# Patient Record
Sex: Female | Born: 1940 | Hispanic: No | Marital: Married | Smoking: Never smoker
Health system: Southern US, Community
[De-identification: ages and names within clinical notes are randomized; demographics above are authoritative.]

## PROBLEM LIST (undated history)

## (undated) DIAGNOSIS — M179 Osteoarthritis of knee, unspecified: Secondary | ICD-10-CM

## (undated) DIAGNOSIS — M810 Age-related osteoporosis without current pathological fracture: Secondary | ICD-10-CM

## (undated) DIAGNOSIS — M1710 Unilateral primary osteoarthritis, unspecified knee: Secondary | ICD-10-CM

## (undated) DIAGNOSIS — E785 Hyperlipidemia, unspecified: Secondary | ICD-10-CM

## (undated) DIAGNOSIS — I10 Essential (primary) hypertension: Secondary | ICD-10-CM

## (undated) DIAGNOSIS — E039 Hypothyroidism, unspecified: Secondary | ICD-10-CM

## (undated) DIAGNOSIS — E119 Type 2 diabetes mellitus without complications: Secondary | ICD-10-CM

## (undated) HISTORY — PX: THYROID SURGERY: SHX805

## (undated) HISTORY — DX: Osteoarthritis of knee, unspecified: M17.9

## (undated) HISTORY — DX: Hypothyroidism, unspecified: E03.9

## (undated) HISTORY — DX: Age-related osteoporosis without current pathological fracture: M81.0

## (undated) HISTORY — DX: Hyperlipidemia, unspecified: E78.5

## (undated) HISTORY — DX: Type 2 diabetes mellitus without complications: E11.9

## (undated) HISTORY — DX: Essential (primary) hypertension: I10

## (undated) HISTORY — PX: NO PAST SURGERIES: SHX2092

## (undated) HISTORY — DX: Unilateral primary osteoarthritis, unspecified knee: M17.10

---

## 2007-05-11 ENCOUNTER — Encounter: Payer: Self-pay | Admitting: Internal Medicine

## 2007-05-11 LAB — CONVERTED CEMR LAB
ALT: 8 units/L
AST: 9 units/L
BUN: 22 mg/dL
Basophils Relative: 0 %
Chloride: 100 meq/L
Creatinine, Ser: 0.64 mg/dL
Eosinophils Relative: 1 %
HCT: 39.8 %
Neutrophils Relative %: 68 %
Potassium: 4.5 meq/L
Sodium: 138 meq/L
Total Bilirubin: 0.3 mg/dL

## 2008-12-05 ENCOUNTER — Emergency Department (HOSPITAL_COMMUNITY): Admission: EM | Admit: 2008-12-05 | Discharge: 2008-12-05 | Payer: Self-pay | Admitting: Emergency Medicine

## 2008-12-11 ENCOUNTER — Encounter (INDEPENDENT_AMBULATORY_CARE_PROVIDER_SITE_OTHER): Payer: Self-pay | Admitting: *Deleted

## 2008-12-11 ENCOUNTER — Ambulatory Visit (HOSPITAL_COMMUNITY): Admission: RE | Admit: 2008-12-11 | Discharge: 2008-12-11 | Payer: Self-pay | Admitting: Emergency Medicine

## 2009-01-24 ENCOUNTER — Ambulatory Visit: Payer: Self-pay | Admitting: Gastroenterology

## 2009-01-24 DIAGNOSIS — R1013 Epigastric pain: Secondary | ICD-10-CM | POA: Insufficient documentation

## 2009-01-30 ENCOUNTER — Telehealth: Payer: Self-pay | Admitting: Gastroenterology

## 2009-01-31 ENCOUNTER — Ambulatory Visit: Payer: Self-pay | Admitting: Gastroenterology

## 2009-02-01 ENCOUNTER — Encounter: Payer: Self-pay | Admitting: Gastroenterology

## 2009-02-22 ENCOUNTER — Encounter (INDEPENDENT_AMBULATORY_CARE_PROVIDER_SITE_OTHER): Payer: Self-pay

## 2009-03-12 ENCOUNTER — Ambulatory Visit: Payer: Self-pay | Admitting: Internal Medicine

## 2009-03-12 DIAGNOSIS — M810 Age-related osteoporosis without current pathological fracture: Secondary | ICD-10-CM | POA: Insufficient documentation

## 2009-03-12 DIAGNOSIS — M1710 Unilateral primary osteoarthritis, unspecified knee: Secondary | ICD-10-CM | POA: Insufficient documentation

## 2009-03-12 DIAGNOSIS — E785 Hyperlipidemia, unspecified: Secondary | ICD-10-CM | POA: Insufficient documentation

## 2009-03-12 DIAGNOSIS — IMO0002 Reserved for concepts with insufficient information to code with codable children: Secondary | ICD-10-CM

## 2009-03-12 DIAGNOSIS — I10 Essential (primary) hypertension: Secondary | ICD-10-CM | POA: Insufficient documentation

## 2009-03-12 DIAGNOSIS — E039 Hypothyroidism, unspecified: Secondary | ICD-10-CM | POA: Insufficient documentation

## 2009-03-13 LAB — CONVERTED CEMR LAB
BUN: 17 mg/dL (ref 6–23)
CO2: 28 meq/L (ref 19–32)
GFR calc non Af Amer: 130.49 mL/min (ref >60)
Glucose, Bld: 113 mg/dL — ABNORMAL HIGH (ref 70–99)
Potassium: 3.8 meq/L (ref 3.5–5.1)

## 2009-03-19 ENCOUNTER — Telehealth: Payer: Self-pay | Admitting: Internal Medicine

## 2009-05-04 ENCOUNTER — Ambulatory Visit: Payer: Self-pay | Admitting: Internal Medicine

## 2009-05-04 DIAGNOSIS — M545 Low back pain, unspecified: Secondary | ICD-10-CM | POA: Insufficient documentation

## 2009-06-06 ENCOUNTER — Ambulatory Visit: Payer: Self-pay | Admitting: Internal Medicine

## 2009-06-06 DIAGNOSIS — R5383 Other fatigue: Secondary | ICD-10-CM

## 2009-06-06 DIAGNOSIS — R5381 Other malaise: Secondary | ICD-10-CM | POA: Insufficient documentation

## 2009-06-06 LAB — CONVERTED CEMR LAB
ALT: 13 units/L (ref 0–35)
AST: 16 units/L (ref 0–37)
Albumin: 3.7 g/dL (ref 3.5–5.2)
BUN: 24 mg/dL — ABNORMAL HIGH (ref 6–23)
Chloride: 104 meq/L (ref 96–112)
Cholesterol: 145 mg/dL (ref 0–200)
Creatinine, Ser: 0.8 mg/dL (ref 0.4–1.2)
GFR calc non Af Amer: 75.81 mL/min (ref >60)
Glucose, Bld: 134 mg/dL — ABNORMAL HIGH (ref 70–99)
HCT: 38.7 % (ref 36.0–46.0)
HDL: 46.3 mg/dL (ref >39.00)
LDL Cholesterol: 73 mg/dL (ref 0–99)
Lymphocytes Relative: 26.3 % (ref 12.0–46.0)
Lymphs Abs: 1.9 10*3/uL (ref 0.7–4.0)
MCV: 85.4 fL (ref 78.0–100.0)
Monocytes Relative: 6.6 % (ref 3.0–12.0)
Neutro Abs: 4.7 10*3/uL (ref 1.4–7.7)
Platelets: 213 10*3/uL (ref 150.0–400.0)
RDW: 13.5 % (ref 11.5–14.6)
Sodium: 138 meq/L (ref 135–145)
Total Bilirubin: 0.4 mg/dL (ref 0.3–1.2)
Total Protein: 6.9 g/dL (ref 6.0–8.3)
WBC: 7.2 10*3/uL (ref 4.5–10.5)

## 2009-06-12 ENCOUNTER — Telehealth: Payer: Self-pay | Admitting: Internal Medicine

## 2009-06-27 ENCOUNTER — Ambulatory Visit: Payer: Self-pay | Admitting: Internal Medicine

## 2009-06-27 DIAGNOSIS — J069 Acute upper respiratory infection, unspecified: Secondary | ICD-10-CM | POA: Insufficient documentation

## 2009-07-19 ENCOUNTER — Ambulatory Visit: Payer: Self-pay | Admitting: Internal Medicine

## 2009-07-19 DIAGNOSIS — R05 Cough: Secondary | ICD-10-CM

## 2009-07-19 DIAGNOSIS — R059 Cough, unspecified: Secondary | ICD-10-CM | POA: Insufficient documentation

## 2009-07-19 DIAGNOSIS — R55 Syncope and collapse: Secondary | ICD-10-CM | POA: Insufficient documentation

## 2009-07-20 ENCOUNTER — Encounter: Payer: Self-pay | Admitting: Internal Medicine

## 2009-08-08 ENCOUNTER — Telehealth: Payer: Self-pay | Admitting: Internal Medicine

## 2010-01-23 ENCOUNTER — Ambulatory Visit: Payer: Self-pay | Admitting: Internal Medicine

## 2010-01-24 ENCOUNTER — Telehealth: Payer: Self-pay | Admitting: Internal Medicine

## 2010-01-28 ENCOUNTER — Telehealth: Payer: Self-pay | Admitting: Internal Medicine

## 2010-01-29 ENCOUNTER — Ambulatory Visit: Payer: Self-pay | Admitting: Internal Medicine

## 2010-01-29 DIAGNOSIS — J209 Acute bronchitis, unspecified: Secondary | ICD-10-CM | POA: Insufficient documentation

## 2010-05-28 NOTE — Progress Notes (Signed)
Summary: RX request  Phone Note Refill Request Message from:  Fax from Pharmacy on June 12, 2009 8:36 AM  Methocarbamol 500mg     **This is not on patient med list, please advise.  Initial call taken by: Lucious Groves,  June 12, 2009 8:37 AM  Follow-up for Phone Call        e-rx done Follow-up by: Newt Lukes MD,  June 12, 2009 8:44 AM  Additional Follow-up for Phone Call Additional follow up Details #1::        Thanks. Additional Follow-up by: Lucious Groves,  June 12, 2009 8:52 AM    New/Updated Medications: ROBAXIN 500 MG TABS (METHOCARBAMOL) 1 by mouth every 8hours as needed for muscle spasms Prescriptions: ROBAXIN 500 MG TABS (METHOCARBAMOL) 1 by mouth every 8hours as needed for muscle spasms  #30 x 1   Entered and Authorized by:   Newt Lukes MD   Signed by:   Newt Lukes MD on 06/12/2009   Method used:   Electronically to        [facility] # [phone]* (retail)       [address] [place], [place]  [address]       Ph: [phone]       Fax: [phone]   RxID:   [id number]

## 2010-05-28 NOTE — Progress Notes (Signed)
Summary: Cold sxs  Phone Note Call from Patient   Caller: Son  Summary of Call: Pt's son called, pt c/o severe cough, body aches, congestion, runny nose and ST. Pt's son is requesting ABX and cough medicine. Please advise. Initial call taken by: Margaret Pyle, CMA,  January 28, 2010 4:21 PM  Follow-up for Phone Call        zpack (erx done) and hydromet - may call into pharm -then notify son of same - thanks Follow-up by: Newt Lukes MD,  January 28, 2010 4:53 PM  Additional Follow-up for Phone Call Additional follow up Details #1::        Pt's son informed, rx called into pharm Additional Follow-up by: Lamar Sprinkles, CMA,  January 28, 2010 5:30 PM    New/Updated Medications: AZITHROMYCIN 250 MG TABS (AZITHROMYCIN) 2 tabs by mouth today, then 1 by mouth daily starting tomorrow HYDROMET 5-1.5 MG/5ML SYRP (HYDROCODONE-HOMATROPINE) 5 cc by mouth every 4 hours as needed for cough Prescriptions: HYDROMET 5-1.5 MG/5ML SYRP (HYDROCODONE-HOMATROPINE) 5 cc by mouth every 4 hours as needed for cough  #120cc x 0   Entered and Authorized by:   Newt Lukes MD   Signed by:   Newt Lukes MD on 01/28/2010   Method used:   Historical   RxID:   [id number] AZITHROMYCIN 250 MG TABS (AZITHROMYCIN) 2 tabs by mouth today, then 1 by mouth daily starting tomorrow  #6 x 0   Entered and Authorized by:   Newt Lukes MD   Signed by:   Newt Lukes MD on 01/28/2010   Method used:   Electronically to        [facility] # [phone]* (retail)       [address] [place], [place]  [address]       Ph: [phone]       Fax: [phone]   RxID:   [id number]

## 2010-05-28 NOTE — Assessment & Plan Note (Signed)
Summary: BP:  160/105  BP MED NOT WORKING-LEGS PAIN--PER SON--STC   Vital Signs:  Patient profile:   70 year old female Height:      61.5 inches Weight:      216.75 pounds BMI:     40.44 O2 Sat:      96 % on Room air Temp:     98.1 degrees F oral Pulse rate:   67 / minute BP sitting:   162 / 96  (left arm) Cuff size:   regular  Vitals Entered By: Margaret Pyle, CMA (January 23, 2010 9:32 AM)  O2 Flow:  Room air CC: HA with Elevated BP, numbness of both feet   Primary Care Provider:  Newt Lukes MD  CC:  HA with Elevated BP and numbness of both feet.  History of Present Illness: HTN - reports compliance with ongoing medical treatment and no changes in medication dose or frequency. denies adverse side effects related to current therapy, but feels BP not well controlled - SBP 150-170 at home - no CP or increase in HA symptoms, no weakness or increase edema  OA - bilateral knee pain, acute on chronic hx shots in knees every year, set of 3 shots --?synvisc s/p steroid shot 3 months ago - improved but not resolved denies new injury - occ swelling no OTC pain pill use or rx med use   Current Medications (verified): 1)  Simvastatin 20 Mg Tabs (Simvastatin) .... Once Daily 2)  Levothyroxine Sodium 75 Mcg Tabs (Levothyroxine Sodium) .... Once Daily 3)  Prilosec Otc 20 Mg Tbec (Omeprazole Magnesium) .... One Tablet By Mouth Once Daily 4)  Losartan Potassium 100 Mg Tabs (Losartan Potassium) .... Once Daily 5)  Atenolol 100 Mg Tabs (Atenolol) .... Once Daily 6)  Hydrochlorothiazide 25 Mg Tabs (Hydrochlorothiazide) .Marland Kitchen.. 1 By Mouth Once Daily 7)  Calcium 500 Mg Tabs (Calcium) .Marland Kitchen.. 1 By Mouth Once Daily 8)  Fosamax 10 Mg Tabs (Alendronate Sodium) .Marland Kitchen.. 1 By Mouth Once Daily  Allergies (verified): No Known Drug Allergies  Review of Systems  The patient denies anorexia, fever, vision loss, chest pain, dyspnea on exertion, and abdominal pain.    Physical  Exam  General:  alert, well-developed, well-nourished, and cooperative to examination.   son at side - pt nonEnglish speaking Lungs:  normal respiratory effort, no intercostal retractions or use of accessory muscles; normal breath sounds bilaterally - no crackles and no wheezes  Heart:  normal rate, regular rhythm, no murmur, and no rub. BLE with trace ankle edema   Impression & Recommendations:  Problem # 1:  HYPERTENSION (ICD-401.9)  Her updated medication list for this problem includes:    Losartan Potassium 100 Mg Tabs (Losartan potassium) ..... Once daily    Atenolol 100 Mg Tabs (Atenolol) ..... Once daily    Hydrochlorothiazide 25 Mg Tabs (Hydrochlorothiazide) .Marland Kitchen... 1 by mouth once daily    Amlodipine Besylate 2.5 Mg Tabs (Amlodipine besylate) .Marland Kitchen... 1 by mouth once daily  add amlodipine to current regimen - watch for inc HA or edema - recheck 2-4 weeks and try to reduce other meds if possible to simplify med regimen  BP today: 162/96 Prior BP: 132/92 (07/19/2009)  Labs Reviewed: K+: 3.7 (06/06/2009) Creat: : 0.8 (06/06/2009)   Chol: 145 (06/06/2009)   HDL: 46.30 (06/06/2009)   LDL: 73 (06/06/2009)   TG: 130.0 (06/06/2009)  Orders: Prescription Created Electronically [phone])  Problem # 2:  ARTHRITIS, RIGHT KNEE (ICD-716.96)  both knees - not controlled with oral antiinflam  meds but try voltaren x 3 days describes series of synthetic viscous replacment shots in past - and ortho considering same f/u as planned 3 mo s/p steroid shot done  Orders: Prescription Created Electronically [phone])  Complete Medication List: 1)  Simvastatin 20 Mg Tabs (Simvastatin) .... Once daily 2)  Levothyroxine Sodium 75 Mcg Tabs (Levothyroxine sodium) .... Once daily 3)  Prilosec Otc 20 Mg Tbec (Omeprazole magnesium) .... One tablet by mouth once daily 4)  Losartan Potassium 100 Mg Tabs (Losartan potassium) .... Once daily 5)  Atenolol 100 Mg Tabs (Atenolol) .... Once daily 6)   Hydrochlorothiazide 25 Mg Tabs (Hydrochlorothiazide) .Marland Kitchen.. 1 by mouth once daily 7)  Calcium 500 Mg Tabs (Calcium) .Marland Kitchen.. 1 by mouth once daily 8)  Fosamax 10 Mg Tabs (Alendronate sodium) .Marland Kitchen.. 1 by mouth once daily 9)  Amlodipine Besylate 2.5 Mg Tabs (Amlodipine besylate) .Marland Kitchen.. 1 by mouth once daily 10)  Diclofenac Sodium 25 Mg Tbec (Diclofenac sodium) .Marland Kitchen.. 1 by mouth two times a day x 3 days then as needed for pain  Other Orders: Flu Vaccine 56yrs + MEDICARE PATIENTS (U0454) Administration Flu vaccine - MCR (U9811) Flu Vaccine Consent Questions     Do you have a history of severe allergic reactions to this vaccine? no    Any prior history of allergic reactions to egg and/or gelatin? no    Do you have a sensitivity to the preservative Thimersol? no    Do you have a past history of Guillan-Barre Syndrome? no    Do you currently have an acute febrile illness? no    Have you ever had a severe reaction to latex? no    Vaccine information given and explained to patient? yes    Are you currently pregnant? no    Lot Number:[id number]   Exp Date:10/26/2010   Site Given  Left Deltoid IM Flu Vaccine 84yrs + MEDICARE PATIENTS (B1478) Administration Flu vaccine - MCR (G9562)  Patient Instructions: 1)  it was good to see you today. 2)  add amlodipine for your blood pressure in additoon to other medications 3)  use diclofenac for pain - 4)  your prescriptions have been electronically submitted to your pharmacy. Please take as directed. Contact our office if you believe you're having problems with the medication(s).  5)  Please schedule a follow-up appointment in 2-4 weeks to recheck bllod pressure and medications, sooner if problems.  Prescriptions: DICLOFENAC SODIUM 25 MG TBEC (DICLOFENAC SODIUM) 1 by mouth two times a day x 3 days then as needed for pain  #40 x 0   Entered and Authorized by:   Newt Lukes MD   Signed by:   Newt Lukes MD on 01/23/2010   Method used:   Electronically to         [facility] # [phone]* (retail)       [address] [place], [place]  [address]       Ph: [phone]       Fax: [phone]   RxID:   [id number] AMLODIPINE BESYLATE 2.5 MG TABS (AMLODIPINE BESYLATE) 1 by mouth once daily  #30 x 3   Entered and Authorized by:   Newt Lukes MD   Signed by:   Newt Lukes MD on 01/23/2010   Method used:   Electronically to        [facility] # [phone]* (retail)       [address] [place]  [place], [place]  [address]       Ph: [phone]       Fax: [phone]   RxID:   [id number]  .lbmedflu

## 2010-05-28 NOTE — Assessment & Plan Note (Signed)
Summary: SEVERE BACK PAIN-LB   Vital Signs:  Patient profile:   70 year old female Height:      60.5 inches (153.67 cm) Weight:      213.4 pounds (97 kg) O2 Sat:      97 % on Room air Temp:     98.2 degrees F (36.78 degrees C) oral Pulse rate:   51 / minute BP sitting:   128 / 98  (left arm) Cuff size:   large  Vitals Entered By: Orlan Leavens (May 04, 2009 1:35 PM)  O2 Flow:  Room air CC: severe (L) back pain. Pt  states it get severe when she starts walking Is Patient Diabetic? No Pain Assessment Patient in pain? yes     Location: lower back Type: aching   Primary Care Provider:  Newt Lukes MD  CC:  severe (L) back pain. Pt  states it get severe when she starts walking.  History of Present Illness: here today with complaint of  back pain. pain is severe 10/10 yesterday - unable to get out of bed all day because of same onset of symptoms was 36 hours ago. course has been sudden onset and now occurs in constant pattern. problem precipitated by nothing - no fall or injury - no lifting or strain symptom characterized as "sharp stabbing" in center of low back symptom radiates to nowhere - no pain or numbness in legs or sides problem not associated with fever or incontinence. symptoms improved by sitiing, not moving. symptoms worsened with prolonged walking (>55min up). prior hx of same symptoms - "arthritis" known for years -  Current Medications (verified): 1)  Simvastatin 20 Mg Tabs (Simvastatin) .... Once Daily 2)  Levothyroxine Sodium 75 Mcg Tabs (Levothyroxine Sodium) .... Once Daily 3)  Prilosec Otc 20 Mg Tbec (Omeprazole Magnesium) .... One Tablet By Mouth Once Daily 4)  Losartan Potassium 100 Mg Tabs (Losartan Potassium) .... Once Daily 5)  Atenolol 100 Mg Tabs (Atenolol) .... Once Daily 6)  Hydrochlorothiazide 25 Mg Tabs (Hydrochlorothiazide) .Marland Kitchen.. 1 By Mouth Once Daily 7)  Tramadol Hcl 50 Mg Tabs (Tramadol Hcl) .Marland Kitchen.. 1 By Mouth Every 6 Hours As Needed  For Arthirits Pain  Allergies (verified): No Known Drug Allergies  Past History:  Past Medical History: Last updated: 03/12/2009 Arthritis Hyperlipidemia Hypertension Osteoporosis hypothyroidism  Review of Systems  The patient denies fever, weight loss, chest pain, syncope, incontinence, muscle weakness, and difficulty walking.    Physical Exam  General:  alert, well-developed, well-nourished, and cooperative to examination.   son at side - pt nonEnglish speaking Lungs:  normal respiratory effort, no intercostal retractions or use of accessory muscles; normal breath sounds bilaterally - no crackles and no wheezes.    Heart:  normal rate, regular rhythm, no murmur, and no rub. BLE with chronic 1+ edema knee-feet.  Msk:  back: full range of motion of lumbar spine. mild tenderness to palpation over lumbar spine. Negative straight leg raise. Deep tendon reflexes symmetrically intact at Achilles and patella, negative clonus. Sensation intact throughout all dermatomes in bilateral lower extremities. Full strength to manual muscle testing in all major muscule groups including EHL, anterior tibialis, gastrocnemius, quadriceps, and iliopsoas. Able to heel and toe walk without difficulty and ambulates with a normal gait.  Skin:  no rashes, vesicles, ulcers, or erythema. No nodules or irregularity to palpation.    Impression & Recommendations:  Problem # 1:  LOW BACK PAIN, ACUTE (ICD-724.2)  r/o compression fx or DDD/slip given hx "  arthritis" - note now off Bisphos by GI recs tx pred pack and muscle relaxants -  no neuro deficits on exam -  reassurance and education provided to pt through son Her updated medication list for this problem includes:    Tramadol Hcl 50 Mg Tabs (Tramadol hcl) .Marland Kitchen... 1 by mouth every 6 hours as needed for arthirits pain    Robaxin 500 Mg Tabs (Methocarbamol) .Marland Kitchen... 1 by mouth at bedtime x 7day and every 8 hours as needed for muscle spasm and  pain  Orders: T-Lumbar Spine 2 Views (72100TC) Prescription Created Electronically [phone])  Complete Medication List: 1)  Simvastatin 20 Mg Tabs (Simvastatin) .... Once daily 2)  Levothyroxine Sodium 75 Mcg Tabs (Levothyroxine sodium) .... Once daily 3)  Prilosec Otc 20 Mg Tbec (Omeprazole magnesium) .... One tablet by mouth once daily 4)  Losartan Potassium 100 Mg Tabs (Losartan potassium) .... Once daily 5)  Atenolol 100 Mg Tabs (Atenolol) .... Once daily 6)  Hydrochlorothiazide 25 Mg Tabs (Hydrochlorothiazide) .Marland Kitchen.. 1 by mouth once daily 7)  Tramadol Hcl 50 Mg Tabs (Tramadol hcl) .Marland Kitchen.. 1 by mouth every 6 hours as needed for arthirits pain 8)  Prednisone (pak) 10 Mg Tabs (Prednisone) .... As directed x 6 days 9)  Robaxin 500 Mg Tabs (Methocarbamol) .Marland Kitchen.. 1 by mouth at bedtime x 7day and every 8 hours as needed for muscle spasm and pain  Patient Instructions: 1)  xray ordered today - your results will be called to you in 48-72 hours from the time of test completion  son=Moe [phone] 2)  prednisone for inflammation and robaxin for muscle relaxants - your prescriptions have been electronically submitted to your pharmacy. Please take as directed. Contact our office if you believe you're having problems with the medication(s).  3)  also ok to take 650-1000mg  of Tylenol every 4-6 hours as needed for relief of pain. AVOID taking more than 4000mg   in a 24 hour period (can cause liver damage in higher doses). 4)  Most patients (90%) with low back pain will improve with time (2-6 weeks). Keep active but avoid activities that are painful. Apply moist heat and/or ice to lower back several times a day. Prescriptions: ROBAXIN 500 MG TABS (METHOCARBAMOL) 1 by mouth at bedtime x 7day and every 8 hours as needed for muscle spasm and pain  #30 x 0   Entered and Authorized by:   Newt Lukes MD   Signed by:   Newt Lukes MD on 05/04/2009   Method used:   Electronically to        [facility] # [phone]* (retail)       [address] [place], [place]  [address]       Ph: [phone]       Fax: [phone]   RxID:   [id number] PREDNISONE (PAK) 10 MG TABS (PREDNISONE) as directed x 6 days  #1 x 0   Entered and Authorized by:   Newt Lukes MD   Signed by:   Newt Lukes MD on 05/04/2009   Method used:   Electronically to        [facility] # [phone]* (retail)       [address] [place], [place]  [address]       Ph: [phone]       Fax: [phone]   RxID:   [id number]

## 2010-05-28 NOTE — Progress Notes (Signed)
Summary: Rx request  Phone Note Call from Patient Call back at [facility] Phone [phone]   Caller: Son Summary of Call: pt's son called stating that [place] is going [place]. Pt is requesting 90 day supply of all medications. Rx sent, pt informed. Initial call taken by: Margaret Pyle, CMA,  August 08, 2009 9:26 AM    Prescriptions: HYDROCHLOROTHIAZIDE 25 MG TABS (HYDROCHLOROTHIAZIDE) 1 by mouth once daily  #90 x 2   Entered by:   Margaret Pyle, CMA   Authorized by:   Newt Lukes MD   Signed by:   Margaret Pyle, CMA on 08/08/2009   Method used:   Electron[facility] to        [facility] # [phone]* (retail)       [address] [place], [place]  [address]       Ph: [phone]       Fax: [phone]   RxID:   [id number] ATENOLOL 100 MG TABS (ATENOLOL) once daily  #90 x 2   Entered by:   Margaret Pyle, CMA   Authorized by:   Newt Lukes MD   Signed by:   Margaret Pyle, CMA on 08/08/2009   Method used:   Electronically to        [facility] # [phone]* (retail)       [address] [place], [place]  [address]       Ph: [phone]       Fax: [phone]   RxID:   [id number] NIDPOEUM POTASSIUM 100 MG TABS (LOSARTAN POTASSIUM) once daily  #90 x 2   Entered by:   Margaret Pyle, CMA   Authorized by:   Newt Lukes MD   Signed by:   Margaret Pyle, CMA on 08/08/2009   Method used:   Electronically to        [facility] # [phone]* (retail)       [address] [place], [place]  [address]       Ph: [phone]       Fax: [phone]   RxID:   [id number] PRILOSEC OTC 20 MG TBEC (OMEPRAZOLE MAGNESIUM) one tablet by mouth once daily  #90 x 2   Entered by:   Margaret Pyle, CMA   Authorized by:   Newt Lukes MD   Signed by:   Margaret Pyle, CMA on 08/08/2009   Method used:   Electronically to        [facility] # [phone]*  (retail)       [address] [place], [place]  [address]       Ph: [phone]       Fax: [phone]   RxID:   [id number] LEVOTHYROXINE SODIUM 75 MCG TABS (LEVOTHYROXINE SODIUM) once daily  #90 x 2   Entered by:   Margaret Pyle, CMA   Authorized by:   Newt Lukes MD   Signed by:   Margaret Pyle, CMA on 08/08/2009   Method used:   Electronically to        [facility] # [phone]* (retail)       [address] [place], [place]  [address]       Ph: [phone]       Fax: [phone]   RxID:  [id number] SIMVASTATIN 20 MG TABS (SIMVASTATIN) once daily  #90 x 2   Entered by:   Margaret Pyle, CMA   Authorized by:   Newt Lukes MD   Signed by:   Margaret Pyle, CMA on 08/08/2009   Method used:   Electronically to        [facility] # [phone]* (retail)       [address] [place], [place]  [address]       Ph: [phone]       Fax: [phone]   RxID:   [id number]

## 2010-05-28 NOTE — Assessment & Plan Note (Signed)
Summary: cough/cd   Vital Signs:  Patient profile:   70 year old female Height:      61.5 inches (156.21 cm) Weight:      212.8 pounds (96.73 kg) O2 Sat:      95 % on Room air Temp:     97.7 degrees F (36.50 degrees C) oral Pulse rate:   64 / minute BP sitting:   122 / 82  (left arm) Cuff size:   large  Vitals Entered By: Orlan Leavens RMA (January 29, 2010 1:44 PM)  O2 Flow:  Room air CC: Cough, URI symptoms Is Patient Diabetic? No Pain Assessment Patient in pain? no      Comments son states mom did start antibiotic & cough syruo not helping constantly coughing all night. Can'nt sleep at night   Primary Care Anissa Abbs:  Newt Lukes MD  CC:  Cough and URI symptoms.  History of Present Illness:  URI Symptoms      This is a 70 year old woman who presents with URI symptoms.  The symptoms began 3 days ago.  The severity is described as moderate.  begun azith and hydromet 48h ago - feels worse with deep cough and unable to sleep - son with concern for ?pneumonia.  The patient reports nasal congestion, sore throat, and productive cough, but denies earache and sick contacts.  Associated symptoms include low-grade fever (<100.5 degrees), dyspnea, wheezing, and use of an antipyretic.  The patient denies rash, vomiting, and diarrhea.  The patient also reports muscle aches and severe fatigue.  The patient denies itchy throat, sneezing, seasonal symptoms, and headache.  The patient denies the following risk factors for Strep sinusitis: tooth pain, Strep exposure, tender adenopathy, and absence of cough.    reviewed chronic med issues:  HTN - reports compliance with ongoing medical treatment and no changes in medication dose or frequency. denies adverse side effects related to current therapy, but feels BP not well controlled - SBP 150-170 at home - no CP or increase in HA symptoms, no weakness or increase edema  OA - bilateral knee pain, acute on chronic hx shots in knees every year,  set of 3 shots --?synvisc s/p steroid shot 3 months ago - improved but not resolved denies new injury - occ swelling no OTC pain pill use or rx med use  hypothyroid - reports compliance with ongoing medical treatment and no changes in medication dose or frequency. denies adverse side effects related to current therapy.    Current Medications (verified): 1)  Simvastatin 20 Mg Tabs (Simvastatin) .... Once Daily - Hold 2)  Levothyroxine Sodium 75 Mcg Tabs (Levothyroxine Sodium) .... Once Daily 3)  Prilosec Otc 20 Mg Tbec (Omeprazole Magnesium) .... One Tablet By Mouth Once Daily 4)  Losartan Potassium 100 Mg Tabs (Losartan Potassium) .... Once Daily 5)  Atenolol 100 Mg Tabs (Atenolol) .... Once Daily 6)  Hydrochlorothiazide 25 Mg Tabs (Hydrochlorothiazide) .Marland Kitchen.. 1 By Mouth Once Daily 7)  Calcium 500 Mg Tabs (Calcium) .Marland Kitchen.. 1 By Mouth Once Daily 8)  Fosamax 10 Mg Tabs (Alendronate Sodium) .Marland Kitchen.. 1 By Mouth Once Daily 9)  Amlodipine Besylate 2.5 Mg Tabs (Amlodipine Besylate) .Marland Kitchen.. 1 By Mouth Once Daily 10)  Diclofenac Sodium 25 Mg Tbec (Diclofenac Sodium) .Marland Kitchen.. 1 By Mouth Two Times A Day X 3 Days Then As Needed For Pain 11)  Azithromycin 250 Mg Tabs (Azithromycin) .... 2 Tabs By Mouth Today, Then 1 By Mouth Daily Starting Tomorrow 12)  Hydromet 5-1.5  Mg/63ml Syrp (Hydrocodone-Homatropine) .... 5 Cc By Mouth Every 4 Hours As Needed For Cough  Allergies (verified): No Known Drug Allergies  Past History:  Past Medical History: Hyperlipidemia Hypertension Osteoporosis hypothyroidism   osteoarthritis, knees  MD roster: [facility] - [facility]  Review of Systems  The patient denies weight loss, decreased hearing, hoarseness, chest pain, peripheral edema, and hemoptysis.    Physical Exam  General:  alert, well-developed, well-nourished, and cooperative to examination.  ill appearing with coughing spells -  son at side - pt nonEnglish speaking Eyes:  vision grossly intact; pupils equal, round  and reactive to light.  conjunctiva and lids normal.    Ears:  normal pinnae bilaterally, without erythema, swelling, or tenderness to palpation. TMs clear, without effusion, or cerumen impaction. Hearing grossly normal bilaterally  Mouth:  teeth and gums in good repair; mucous membranes moist, without lesions or ulcers. oropharynx clear without exudate, mod erythema.  Lungs:  few scattered rhonchi - upper airway psuedowheeze - good air mvmt w/o crackles Heart:  normal rate, regular rhythm, no murmur, and no rub. BLE with trace ankle edema   Impression & Recommendations:  Problem # 1:  ACUTE BRONCHITIS (ICD-466.0)  steroid shot - change abx and cough syrup today - reassured no clinical evidence for pneumonia Her updated medication list for this problem includes:    Levaquin 500 Mg Tabs (Levofloxacin) .Marland Kitchen... 1 by mouth once daily x 7 days    Promethazine Vc/codeine 6.25-5-10 Mg/66ml Syrp (Phenyleph-promethazine-cod) .Marland Kitchen... 5cc by mouth every 4 hours as needed for cough  Take antibiotics and other medications as directed. Encouraged to push clear liquids, get enough rest, and take acetaminophen as needed. To be seen in 5-7 days if no improvement, sooner if worse.  Orders: Depo- Medrol 80mg  (J1040) Depo- Medrol 40mg  (J1030) Admin of Therapeutic Inj  intramuscular or subcutaneous (16109)  Complete Medication List: 1)  Simvastatin 20 Mg Tabs (Simvastatin) .... Once daily - hold 2)  Levothyroxine Sodium 75 Mcg Tabs (Levothyroxine sodium) .... Once daily 3)  Prilosec Otc 20 Mg Tbec (Omeprazole magnesium) .... One tablet by mouth once daily 4)  Losartan Potassium 100 Mg Tabs (Losartan potassium) .... Once daily 5)  Atenolol 100 Mg Tabs (Atenolol) .... Once daily 6)  Hydrochlorothiazide 25 Mg Tabs (Hydrochlorothiazide) .Marland Kitchen.. 1 by mouth once daily 7)  Calcium 500 Mg Tabs (Calcium) .Marland Kitchen.. 1 by mouth once daily 8)  Fosamax 10 Mg Tabs (Alendronate sodium) .Marland Kitchen.. 1 by mouth once daily 9)  Amlodipine  Besylate 2.5 Mg Tabs (Amlodipine besylate) .Marland Kitchen.. 1 by mouth once daily 10)  Diclofenac Sodium 25 Mg Tbec (Diclofenac sodium) .Marland Kitchen.. 1 by mouth two times a day x 3 days then as needed for pain 11)  Levaquin 500 Mg Tabs (Levofloxacin) .Marland Kitchen.. 1 by mouth once daily x 7 days 12)  Promethazine Vc/codeine 6.25-5-10 Mg/73ml Syrp (Phenyleph-promethazine-cod) .... 5cc by mouth every 4 hours as needed for cough  Patient Instructions: 1)  it was good to see you today. 2)  stop azithromycin and hydromet syrup - 3)  shot of medrol today for your cough and wheeze 4)  start Levaquin and promethazine woith codiene syrup - your prescriptions have been given to you to submit to your pharmacy. Please take as directed. Contact our office if you believe you're having problems with the medication(s).  5)  Get plenty of rest, drink lots of clear liquids, and use Tylenol or Ibuprofen for fever and comfort. Return in 7-10 days if you're not better:sooner  if you're feeling worse. Prescriptions: PROMETHAZINE VC/CODEINE 6.25-5-10 MG/5ML SYRP (PHENYLEPH-PROMETHAZINE-COD) 5cc by mouth every 4 hours as needed for cough  #6 oz x 0   Entered and Authorized by:   Newt Lukes MD   Signed by:   Newt Lukes MD on 01/29/2010   Method used:   Print then Give to Patient   RxID:   [id number] LEVAQUIN 500 MG TABS (LEVOFLOXACIN) 1 by mouth once daily x 7 days  #7 x 0   Entered and Authorized by:   Newt Lukes MD   Signed by:   Newt Lukes MD on 01/29/2010   Method used:   Print then Give to Patient   RxID:   [id number]    Medication Administration  Injection # 1:    Medication: Depo- Medrol 80mg     Diagnosis: ACUTE BRONCHITIS (ICD-466.0)    Route: IM    Site: LUOQ gluteus    Exp Date: 07/2012    Lot #: [id number]    Mfr: [employer]    Comments: Gave total of 120mg     Patient tolerated injection without complications    Given by: Orlan Leavens RMA (January 29, 2010 2:16 PM)  Injection # 2:     Medication: Depo- Medrol 40mg     Diagnosis: ACUTE BRONCHITIS (ICD-466.0)  Orders Added: 1)  Est. Patient Level IV [42595] 2)  Depo- Medrol 80mg  [J1040] 3)  Depo- Medrol 40mg  [J1030] 4)  Admin of Therapeutic Inj  intramuscular or subcutaneous [63875]

## 2010-05-28 NOTE — Assessment & Plan Note (Signed)
Summary: cold symptoms/per triage/cd   Vital Signs:  Patient profile:   70 year old female Height:      60.5 inches (153.67 cm) Weight:      213.50 pounds (97.05 kg) O2 Sat:      95 % Temp:     97.7 degrees F (36.50 degrees C) oral Pulse rate:   64 / minute BP sitting:   140 / 90  (left arm) Cuff size:   large  Vitals Entered By: Sydell Axon (June 27, 2009 2:48 PM) CC: sore throat/cough, cold symptoms/[facility], URI symptoms Is Patient Diabetic? No Pain Assessment Patient in pain? no        Primary Care Provider:  Newt Lukes MD  CC:  sore throat/cough, cold symptoms/[place], and URI symptoms.  History of Present Illness:  URI Symptoms      This is a 70 year old woman who presents with URI symptoms.  The symptoms began 2 days ago.  The severity is described as moderate.  The patient reports nasal congestion, clear nasal discharge, sore throat, dry cough, and sick contacts, but denies productive cough and earache.  Associated symptoms include dyspnea.  The patient denies fever, vomiting, and diarrhea.  The patient also reports itchy throat, sneezing, and headache.  The patient denies severe fatigue.  Risk factors for Strep sinusitis include tender adenopathy.    Current Medications (verified): 1)  Simvastatin 20 Mg Tabs (Simvastatin) .... Once Daily 2)  Levothyroxine Sodium 75 Mcg Tabs (Levothyroxine Sodium) .... Once Daily 3)  Prilosec Otc 20 Mg Tbec (Omeprazole Magnesium) .... One Tablet By Mouth Once Daily 4)  Losartan Potassium 100 Mg Tabs (Losartan Potassium) .... Once Daily 5)  Atenolol 100 Mg Tabs (Atenolol) .... Once Daily 6)  Hydrochlorothiazide 25 Mg Tabs (Hydrochlorothiazide) .Marland Kitchen.. 1 By Mouth Once Daily 7)  Tramadol Hcl 50 Mg Tabs (Tramadol Hcl) .Marland Kitchen.. 1 By Mouth Every 6 Hours As Needed For Arthirits Pain 8)  Meloxicam 7.5 Mg Tabs (Meloxicam) .Marland Kitchen.. 1 By Mouth Once Daily As Needed For Arthritis Pain  Allergies (verified): No Known Drug Allergies  Past  History:  Past Medical History: Reviewed history from 06/06/2009 and no changes required. Arthritis Hyperlipidemia Hypertension Osteoporosis hypothyroidism  MD rooster: [facility] - [facility]  Review of Systems       The patient complains of anorexia.  The patient denies syncope, peripheral edema, hemoptysis, and abdominal pain.    Physical Exam  General:  alert, well-developed, well-nourished, and cooperative to examination.   son at side - pt nonEnglish speaking Eyes:  vision grossly intact; pupils equal, round and reactive to light.  conjunctiva and lids normal.    Ears:  normal pinnae bilaterally, without erythema, swelling, or tenderness to palpation. TMs clear, without effusion, or cerumen impaction. Hearing grossly normal bilaterally  Mouth:  teeth and gums in good repair; mucous membranes moist, without lesions or ulcers. oropharynx clear without exudate, mod erythema.  Lungs:  normal respiratory effort, no intercostal retractions or use of accessory muscles; normal breath sounds bilaterally - no crackles and no wheezes.    Heart:  normal rate, regular rhythm, no murmur, and no rub. BLE without edema   Impression & Recommendations:  Problem # 1:  VIRAL URI (ICD-465.9)  Her updated medication list for this problem includes:    Meloxicam 7.5 Mg Tabs (Meloxicam) .Marland Kitchen... 1 by mouth once daily as needed for arthritis pain    Hydromet 5-1.5 Mg/20ml Syrp (Hydrocodone-homatropine) .Marland Kitchen... 5cc by mouth every 4 hours as needed for  cough  Instructed on symptomatic treatment. Call if symptoms persist or worsen.   Complete Medication List: 1)  Simvastatin 20 Mg Tabs (Simvastatin) .... Once daily 2)  Levothyroxine Sodium 75 Mcg Tabs (Levothyroxine sodium) .... Once daily 3)  Prilosec Otc 20 Mg Tbec (Omeprazole magnesium) .... One tablet by mouth once daily 4)  Losartan Potassium 100 Mg Tabs (Losartan potassium) .... Once daily 5)  Atenolol 100 Mg Tabs (Atenolol) .... Once daily 6)   Hydrochlorothiazide 25 Mg Tabs (Hydrochlorothiazide) .Marland Kitchen.. 1 by mouth once daily 7)  Tramadol Hcl 50 Mg Tabs (Tramadol hcl) .Marland Kitchen.. 1 by mouth every 6 hours as needed for arthirits pain 8)  Meloxicam 7.5 Mg Tabs (Meloxicam) .Marland Kitchen.. 1 by mouth once daily as needed for arthritis pain 9)  Hydromet 5-1.5 Mg/54ml Syrp (Hydrocodone-homatropine) .... 5cc by mouth every 4 hours as needed for cough 10)  Azithromycin 250 Mg Tabs (Azithromycin) .... 2 tabs by mouth today, then 1 by mouth daily starting tomorrow  Patient Instructions: 1)  it was good to see you today.  2)  if you develop worsening symptoms or fever, call us and we can reconsider antibiotics but it does not appear necessary to use any anitbiotic at this time (prescription for Zpack given to use "in case" as discussed) 3)  cough syrup for rest and sore throat - may cause sedation- 4)  ok to use tylenol cold or DayQuil as needed  5)  Get plenty of rest, drink lots of clear liquids, and use Tylenol or Ibuprofen for fever and comfort. Return in 7-10 days if you're not better:sooner if you're feeling worse. Prescriptions: AZITHROMYCIN 250 MG TABS (AZITHROMYCIN) 2 tabs by mouth today, then 1 by mouth daily starting tomorrow  #6 x 0   Entered and Authorized by:   Newt Lukes MD   Signed by:   Newt Lukes MD on 06/27/2009   Method used:   Print then Give to Patient   RxID:   [id number] HYDROMET 5-1.5 MG/5ML SYRP (HYDROCODONE-HOMATROPINE) 5cc by mouth every 4 hours as needed for cough  #120cc x 0   Entered and Authorized by:   Newt Lukes MD   Signed by:   Newt Lukes MD on 06/27/2009   Method used:   Print then Give to Patient   RxID:   [id number]

## 2010-05-28 NOTE — Progress Notes (Signed)
Summary: Drug interaction  Phone Note From Pharmacy   [facility] [phone]* Summary of Call: Per pharmacy there is a drug interaction with pt's Amlodipine and Simvastatin. Per [facility] pharmacy notified to HOLD SImvastatin for now. Initial call taken by: Margaret Pyle, CMA,  January 24, 2010 8:40 AM    New/Updated Medications: SIMVASTATIN 20 MG TABS (SIMVASTATIN) once daily - HOLD

## 2010-05-28 NOTE — Medication Information (Signed)
Summary: [facility]  [facility]   Imported By: Sherian Rein 01/28/2010 08:05:39  _____________________________________________________________________  External Attachment:    Type:   Image     Comment:   External Document

## 2010-05-28 NOTE — Assessment & Plan Note (Signed)
Summary: DISCUSS HAVING A SHOT THAT SHE GETS ONCE A YEAR/ [facility]   Vital Signs:  Patient profile:   70 year old female Height:      60.5 inches (153.67 cm) Weight:      215.8 pounds (98.09 kg) O2 Sat:      97 % on Room air Temp:     98.1 degrees F (36.72 degrees C) oral Pulse rate:   64 / minute BP sitting:   132 / 92  (left arm) Cuff size:   large  Vitals Entered By: Orlan Leavens (July 19, 2009 10:57 AM)  O2 Flow:  Room air CC: discuss getting shot every year Is Patient Diabetic? No Pain Assessment Patient in pain? no        Primary Care Provider:  Newt Lukes MD  CC:  discuss getting shot every year.  History of Present Illness:  1) bilateral knee pain, acute on chronic ?gets shots in knees every year, set of 3 shots -- increasing flares of pain and feels it is time for shot since last one >12 mos ago denies new injury - occ swelling uses meloxicam only occassionally  2) syncope -  sudden onset passed out and feel forward while out shopping last week - no prodrome warning - "out cold" for <1 min bruised right forearm but denies head trauma or other injury - no CP, palp or SOB -  3) continued cough -  has improved s/p zpack tx but now with producitve sputum again - symptoms worst at night - uses nyquil no edema or SOB, no pain with cough in chest or back    Current Medications (verified): 1)  Simvastatin 20 Mg Tabs (Simvastatin) .... Once Daily 2)  Levothyroxine Sodium 75 Mcg Tabs (Levothyroxine Sodium) .... Once Daily 3)  Prilosec Otc 20 Mg Tbec (Omeprazole Magnesium) .... One Tablet By Mouth Once Daily 4)  Losartan Potassium 100 Mg Tabs (Losartan Potassium) .... Once Daily 5)  Atenolol 100 Mg Tabs (Atenolol) .... Once Daily 6)  Hydrochlorothiazide 25 Mg Tabs (Hydrochlorothiazide) .Marland Kitchen.. 1 By Mouth Once Daily 7)  Tramadol Hcl 50 Mg Tabs (Tramadol Hcl) .Marland Kitchen.. 1 By Mouth Every 6 Hours As Needed For Arthirits Pain 8)  Meloxicam 7.5 Mg Tabs (Meloxicam) .Marland Kitchen.. 1  By Mouth Once Daily As Needed For Arthritis Pain  Allergies (verified): No Known Drug Allergies  Past History:  Past Medical History: Hyperlipidemia Hypertension Osteoporosis hypothyroidism   osteoarthritis, knees  MD rooster: [facility] - [facility] - [facility]  Review of Systems       The patient complains of syncope and prolonged cough.  The patient denies fever, dyspnea on exertion, peripheral edema, headaches, hemoptysis, and abdominal pain.    Physical Exam  General:  alert, well-developed, well-nourished, and cooperative to examination.   son at side - pt nonEnglish speaking Eyes:  vision grossly intact; pupils equal, round and reactive to light.  conjunctiva and lids normal.    Lungs:  normal respiratory effort, no intercostal retractions or use of accessory muscles; normal breath sounds bilaterally - no crackles and no wheezes but +rhonchi and bronchial sounds.    Heart:  normal rate, regular rhythm, no murmur, and no rub. BLE without edema Msk:  bilateral knees: decreased range of motion, diffuse boggy synovitis. Tender to palpation on joint line. Increased pain with weight bearing. Positive crepitus.    Impression & Recommendations:  Problem # 1:  ARTHRITIS, RIGHT KNEE (ICD-716.96) flare in both knees - not controlled with oral antiinflam  meds describes series of synthetic viscous replacment shots - will send for records and send to orth local for consideration of same or other tx Orders: Orthopedic Referral (Ortho)  Problem # 2:  SYNCOPE (ICD-780.2) describes "on/off" episode - no prodromal warning - EKG now - no ischemic changes or arrythmia - BP stable - no recurrent symptoms - if recurrence, consider stress echo or other further cardiac eval but declines further eval for now: "she thinks maybe she was just tired" Orders: EKG w/ Interpretation (93000)  Problem # 3:  COUGH (ICD-786.2)  retx bronchitis symptoms with zpack - cont nyquil as needed    Orders: Prescription Created Electronically [phone])  Complete Medication List: 1)  Simvastatin 20 Mg Tabs (Simvastatin) .... Once daily 2)  Levothyroxine Sodium 75 Mcg Tabs (Levothyroxine sodium) .... Once daily 3)  Prilosec Otc 20 Mg Tbec (Omeprazole magnesium) .... One tablet by mouth once daily 4)  Losartan Potassium 100 Mg Tabs (Losartan potassium) .... Once daily 5)  Atenolol 100 Mg Tabs (Atenolol) .... Once daily 6)  Hydrochlorothiazide 25 Mg Tabs (Hydrochlorothiazide) .Marland Kitchen.. 1 by mouth once daily 7)  Tramadol Hcl 50 Mg Tabs (Tramadol hcl) .Marland Kitchen.. 1 by mouth every 6 hours as needed for arthirits pain 8)  Meloxicam 7.5 Mg Tabs (Meloxicam) .Marland Kitchen.. 1 by mouth once daily as needed for arthritis pain 9)  Azithromycin 250 Mg Tabs (Azithromycin) .... 2 tabs by mouth today, then 1 by mouth daily starting tomorrow  Patient Instructions: 1)  it was good to see you today. 2)  your heart looks normal - let us know if you have any dizzy spells or any more symptoms of falling and passing out 3)  we'll make referral to knee doctor for shots. Our office will contact you regarding this appointment once made.  4)  We will also send for records about your old shots from arthritis doctors in [place]. 5)  another round of antibiotics for your cough - your prescriptions have been electronically submitted to your pharmacy. Please take as directed. Contact our office if you believe you're having problems with the medication(s). If continued coughing, we will need to chech chest xray next visit - Prescriptions: AZITHROMYCIN 250 MG TABS (AZITHROMYCIN) 2 tabs by mouth today, then 1 by mouth daily starting tomorrow  #6 x 0   Entered and Authorized by:   Newt Lukes MD   Signed by:   Newt Lukes MD on 07/19/2009   Method used:   Electronically to        [phone]* (retail)       [address] [place], [place]  [address]       Ph: [phone]       Fax: [phone]   RxID:    [id number]

## 2010-05-28 NOTE — Assessment & Plan Note (Signed)
Summary: weak -sleeping a lot--d/t per son---stc   Vital Signs:  Patient profile:   70 year old female Height:      60.5 inches (153.67 cm) Weight:      214.8 pounds (97.64 kg) O2 Sat:      96 % on Room air Temp:     98.4 degrees F (36.89 degrees C) oral Pulse rate:   59 / minute BP sitting:   102 / 84  (left arm) Cuff size:   large  Vitals Entered By: Orlan Leavens (June 06, 2009 9:13 AM)  O2 Flow:  Room air CC: Weak, Sleeping alot Is Patient Diabetic? No Pain Assessment Patient in pain? no        Primary Care Provider:  Newt Lukes MD  CC:  Weak and Sleeping alot.  History of Present Illness: here today with complaint of fatigue. onset of symptoms was 1 week ago. course has been gradual onset and now occurs in  intermittent waxing/waning pattern. problem precipitated by ?change in weather symptom characterized as sleeping all the time, no energy to go out problem not associated with fever, pain (in head, chest or abd), rash, swelling or confusion. symptoms improved by nothing - napping and resting does not help. + prior hx of same symptoms - "when neck medicine has to be adjusted (thyroid)".   dyslipidemia - reports compliance with ongoing medical treatment and no changes in medication dose or frequency. denies adverse side effects related to current therapy.   htn - reports compliance with ongoing medical treatment and no changes in medication dose or frequency. denies adverse side effects related to current therapy. no dizziness or CP, no edema  hypothyroid - reports compliance with ongoing medical treatment and no changes in medication dose or frequency. denies adverse side effects related to current therapy. see above discussion  arthritis - still aching, worse in the cold - back, right>left knee, ultram not effective becauses causes sedation (so pt won't take) - same with robaxin  Current Medications (verified): 1)  Simvastatin 20 Mg Tabs (Simvastatin)  .... Once Daily 2)  Levothyroxine Sodium 75 Mcg Tabs (Levothyroxine Sodium) .... Once Daily 3)  Prilosec Otc 20 Mg Tbec (Omeprazole Magnesium) .... One Tablet By Mouth Once Daily 4)  Losartan Potassium 100 Mg Tabs (Losartan Potassium) .... Once Daily 5)  Atenolol 100 Mg Tabs (Atenolol) .... Once Daily 6)  Hydrochlorothiazide 25 Mg Tabs (Hydrochlorothiazide) .Marland Kitchen.. 1 By Mouth Once Daily 7)  Tramadol Hcl 50 Mg Tabs (Tramadol Hcl) .Marland Kitchen.. 1 By Mouth Every 6 Hours As Needed For Arthirits Pain  Allergies (verified): No Known Drug Allergies  Past History:  Past Medical History: Arthritis Hyperlipidemia Hypertension Osteoporosis hypothyroidism  MD rooster: [facility] - [facility]  Review of Systems  The patient denies anorexia, fever, weight loss, chest pain, headaches, and abdominal pain.    Physical Exam  General:  alert, well-developed, well-nourished, and cooperative to examination.   son at side - pt nonEnglish speaking Neck:  No deformities, masses, or tenderness noted. Lungs:  normal respiratory effort, no intercostal retractions or use of accessory muscles; normal breath sounds bilaterally - no crackles and no wheezes.    Heart:  normal rate, regular rhythm, no murmur, and no rub. BLE with chronic 1+ edema knee-feet.    Impression & Recommendations:  Problem # 1:  FATIGUE (ICD-780.79) exam unremarkable - nontoxic - r/o lab abn such as thyroid balance, anemia, dehydration, infection, renal issues, etc reassurance provided Orders: TLB-CBC Platelet - w/Differential (85025-CBCD) TLB-TSH (  Thyroid Stimulating Hormone) (84443-TSH) TLB-BMP (Basic Metabolic Panel-BMET) (80048-METABOL) TLB-Hepatic/Liver Function Pnl (80076-HEPATIC)  Problem # 2:  HYPOTHYROIDISM (ICD-244.9) recheck labs now and adjust dose as needed  Her updated medication list for this problem includes:    Levothyroxine Sodium 75 Mcg Tabs (Levothyroxine sodium) ..... Once daily  Orders: TLB-TSH (Thyroid Stimulating Hormone)  (84443-TSH)  Labs Reviewed: TSH: 3.25 (03/12/2009)     Problem # 3:  DYSLIPIDEMIA (ICD-272.4) check FLP and LFTs Her updated medication list for this problem includes:    Simvastatin 20 Mg Tabs (Simvastatin) ..... Once daily  Orders: TLB-Lipid Panel (80061-LIPID)  Problem # 4:  HYPERTENSION (ICD-401.9)  Her updated medication list for this problem includes:    Losartan Potassium 100 Mg Tabs (Losartan potassium) ..... Once daily    Atenolol 100 Mg Tabs (Atenolol) ..... Once daily    Hydrochlorothiazide 25 Mg Tabs (Hydrochlorothiazide) .Marland Kitchen... 1 by mouth once daily  Orders: TLB-BMP (Basic Metabolic Panel-BMET) (80048-METABOL)  BP today: 102/84 Prior BP: 128/98 (05/04/2009)  Labs Reviewed: K+: 3.8 (03/12/2009) Creat: : 0.5 (03/12/2009)     Problem # 5:  ARTHRITIS, RIGHT KNEE (ICD-716.96) change ultram to mobic (as ibuprofen helps but causes stomach upset) Orders: Prescription Created Electronically [phone])  Complete Medication List: 1)  Simvastatin 20 Mg Tabs (Simvastatin) .... Once daily 2)  Levothyroxine Sodium 75 Mcg Tabs (Levothyroxine sodium) .... Once daily 3)  Prilosec Otc 20 Mg Tbec (Omeprazole magnesium) .... One tablet by mouth once daily 4)  Losartan Potassium 100 Mg Tabs (Losartan potassium) .... Once daily 5)  Atenolol 100 Mg Tabs (Atenolol) .... Once daily 6)  Hydrochlorothiazide 25 Mg Tabs (Hydrochlorothiazide) .Marland Kitchen.. 1 by mouth once daily 7)  Tramadol Hcl 50 Mg Tabs (Tramadol hcl) .Marland Kitchen.. 1 by mouth every 6 hours as needed for arthirits pain 8)  Meloxicam 7.5 Mg Tabs (Meloxicam) .Marland Kitchen.. 1 by mouth once daily as needed for arthritis pain  Patient Instructions: 1)  it was good to see you today.  2)  test(s) ordered today - your results will called to [facility] after our review in 48-72 hours from the time of test completion; if any changes need to be made or there are abnormal results, you will be notified at that time. 3)  try new medication (meloxicam) for arthritis -  will not cause sedation - your prescription has been electronically submitted to your pharmacy. Please take as directed. Contact our office if you believe you're having problems with the medication(s).   4)  Please schedule a follow-up appointment in 4-6 months or as previously scheduled, sooner if problems.  Prescriptions: MELOXICAM 7.5 MG TABS (MELOXICAM) 1 by mouth once daily as needed for arthritis pain  #30 x 3   Entered and Authorized by:   Newt Lukes MD   Signed by:   Newt Lukes MD on 06/06/2009   Method used:   Electronically to        [facility] [phone] (retail)       [address] [place], [place]  [address]       Ph: [phone]       Fax: [phone]   RxID:   [id number]

## 2010-07-18 ENCOUNTER — Other Ambulatory Visit: Payer: Self-pay | Admitting: Internal Medicine

## 2010-07-22 ENCOUNTER — Other Ambulatory Visit: Payer: Self-pay | Admitting: Internal Medicine

## 2010-07-26 ENCOUNTER — Encounter: Payer: Self-pay | Admitting: Internal Medicine

## 2010-07-26 DIAGNOSIS — E039 Hypothyroidism, unspecified: Secondary | ICD-10-CM

## 2010-07-29 ENCOUNTER — Other Ambulatory Visit: Payer: Self-pay | Admitting: Internal Medicine

## 2010-07-29 ENCOUNTER — Ambulatory Visit (INDEPENDENT_AMBULATORY_CARE_PROVIDER_SITE_OTHER): Payer: Medicare Other | Admitting: Internal Medicine

## 2010-07-29 ENCOUNTER — Encounter: Payer: Self-pay | Admitting: Internal Medicine

## 2010-07-29 ENCOUNTER — Other Ambulatory Visit (INDEPENDENT_AMBULATORY_CARE_PROVIDER_SITE_OTHER): Payer: Medicare Other | Admitting: Internal Medicine

## 2010-07-29 ENCOUNTER — Other Ambulatory Visit (INDEPENDENT_AMBULATORY_CARE_PROVIDER_SITE_OTHER): Payer: Medicare Other

## 2010-07-29 DIAGNOSIS — R5383 Other fatigue: Secondary | ICD-10-CM

## 2010-07-29 DIAGNOSIS — E039 Hypothyroidism, unspecified: Secondary | ICD-10-CM

## 2010-07-29 DIAGNOSIS — R5381 Other malaise: Secondary | ICD-10-CM

## 2010-07-29 DIAGNOSIS — E785 Hyperlipidemia, unspecified: Secondary | ICD-10-CM

## 2010-07-29 DIAGNOSIS — I10 Essential (primary) hypertension: Secondary | ICD-10-CM

## 2010-07-29 LAB — BASIC METABOLIC PANEL
BUN: 23 mg/dL (ref 6–23)
Chloride: 97 mEq/L (ref 96–112)
Glucose, Bld: 143 mg/dL — ABNORMAL HIGH (ref 70–99)
Potassium: 4.1 mEq/L (ref 3.5–5.1)

## 2010-07-29 LAB — CBC WITH DIFFERENTIAL/PLATELET
Basophils Relative: 0.1 % (ref 0.0–3.0)
Eosinophils Absolute: 0.1 10*3/uL (ref 0.0–0.7)
HCT: 38.5 % (ref 36.0–46.0)
Lymphs Abs: 2.1 10*3/uL (ref 0.7–4.0)
MCHC: 33 g/dL (ref 30.0–36.0)
MCV: 83.8 fl (ref 78.0–100.0)
Monocytes Absolute: 0.4 10*3/uL (ref 0.1–1.0)
Neutro Abs: 6.3 10*3/uL (ref 1.4–7.7)
Neutrophils Relative %: 70.9 % (ref 43.0–77.0)
RBC: 4.6 Mil/uL (ref 3.87–5.11)

## 2010-07-29 LAB — HEPATIC FUNCTION PANEL
Alkaline Phosphatase: 79 U/L (ref 39–117)
Bilirubin, Direct: 0.1 mg/dL (ref 0.0–0.3)
Total Bilirubin: 0.6 mg/dL (ref 0.3–1.2)
Total Protein: 6.6 g/dL (ref 6.0–8.3)

## 2010-07-29 LAB — LIPID PANEL: VLDL: 25.4 mg/dL (ref 0.0–40.0)

## 2010-07-29 LAB — LDL CHOLESTEROL, DIRECT: Direct LDL: 138.9 mg/dL

## 2010-07-29 NOTE — Assessment & Plan Note (Signed)
?  off statin since 12/2009 - check labs and LFTs now - caution and low dose as needed also on amlodipine

## 2010-07-29 NOTE — Progress Notes (Signed)
  Subjective:    Patient ID: Ashley Frederick, female    DOB: [birth year], 70 y.o.   MRN: [id number]  HPI Here for follow up  Hypothyroid - the patient reports compliance with medication(s) as prescribed. Denies adverse side effects.  Dyslipidemia - previously on statin but holding since starting 12/2009 amlodipine-  HTN - reports compliance with ongoing medical treatment and no changes in medication dose or frequency. denies adverse side effects related to current therapy, but feels BP not well controlled - SBP 150-170 at home - no CP or increase in HA symptoms, no weakness or increase edema  OA - bilateral knee pain, acute on chronic Ongoing synvisc shots in knees per ortho - s/p steroid shot as needed - improved overall denies new injury - occ swelling no OTC pain pill use or rx med use  Past Medical History  Diagnosis Date  . HYPOTHYROIDISM 03/12/2009  . DYSLIPIDEMIA 03/12/2009  . HYPERTENSION 03/12/2009  . OSTEOPOROSIS 03/12/2009  . OA (osteoarthritis) of knee    Review of Systems  Constitutional: Positive for fatigue. Negative for fever.  Respiratory: Negative for shortness of breath.   Cardiovascular: Negative for chest pain.       Objective:   Physical Exam BP 128/82  Pulse 54  Temp(Src) 97.9 F (36.6 C) (Oral)  Ht 5' 1.5" (1.562 m)  Wt 213 lb (96.616 kg)  BMI 39.59 kg/m2  SpO2 94% Physical Exam  Constitutional: She appears well-developed and well-nourished. No distress. Son at side (pt nonEnglish speaking) Eyes: Conjunctivae and EOM are normal. Pupils are equal, round, and reactive to light. No scleral icterus.  Neck: Normal range of motion. Neck supple. No JVD present. No thyromegaly present.  Cardiovascular: Normal rate, regular rhythm and normal heart sounds.  No murmur heard. Pulmonary/Chest: Effort normal and breath sounds normal. No respiratory distress. She has no wheezes.  Neurological: She is alert and oriented to person, place, and time. No cranial nerve  deficit. Coordination normal.  Skin: Skin is warm and dry. No rash noted. No erythema.  Psychiatric: She has a normal mood and affect. Her behavior is normal. Judgment and thought content normal.   Lab Results  Component Value Date   WBC 7.2 06/06/2009   HGB 12.4 06/06/2009   HCT 38.7 06/06/2009   PLT 213.0 06/06/2009   CHOL 145 06/06/2009   TRIG 130.0 06/06/2009   HDL 46.30 06/06/2009   ALT 13 06/06/2009   AST 16 06/06/2009   NA 138 06/06/2009   K 3.7 06/06/2009   CL 104 06/06/2009   CREATININE 0.8 06/06/2009   BUN 24* 06/06/2009   CO2 29 06/06/2009   TSH 2.53 06/06/2009      Assessment & Plan:  See problem list. Medications and labs reviewed today.

## 2010-07-29 NOTE — Assessment & Plan Note (Signed)
Nonspecific hx and benign exam - check labs now

## 2010-07-29 NOTE — Assessment & Plan Note (Signed)
The current medical regimen is effective;  continue present plan and medications. Check lab now

## 2010-07-29 NOTE — Patient Instructions (Signed)
It was good to see you today. Test(s) ordered today. Your results will be called to you after review (48-72hours after test completion). If any changes need to be made, you will be notified at that time. Refill on medication(s) as discussed today. Medications reviewed, no changes at this time. Please schedule followup in 6 months to monitor blood pressure and medications, call sooner if problems.

## 2010-07-29 NOTE — Assessment & Plan Note (Signed)
The current medical regimen is effective;  continue present plan and medications. BP Readings from Last 3 Encounters:  07/29/10 128/82  01/29/10 122/82  01/23/10 162/96

## 2010-07-30 ENCOUNTER — Telehealth (INDEPENDENT_AMBULATORY_CARE_PROVIDER_SITE_OTHER): Payer: Medicare Other | Admitting: Internal Medicine

## 2010-07-30 DIAGNOSIS — I10 Essential (primary) hypertension: Secondary | ICD-10-CM

## 2010-07-30 DIAGNOSIS — E039 Hypothyroidism, unspecified: Secondary | ICD-10-CM

## 2010-07-30 MED ORDER — ATENOLOL 100 MG PO TABS: 100.0000 mg | ORAL_TABLET | Freq: Every day | ORAL | Status: DC

## 2010-07-30 MED ORDER — HYDROCHLOROTHIAZIDE 25 MG PO TABS: 25.0000 mg | ORAL_TABLET | Freq: Every day | ORAL | Status: DC

## 2010-07-30 MED ORDER — LOSARTAN POTASSIUM 100 MG PO TABS: 100.0000 mg | ORAL_TABLET | Freq: Every day | ORAL | Status: DC

## 2010-07-30 MED ORDER — AMLODIPINE BESYLATE 2.5 MG PO TABS: 2.5000 mg | ORAL_TABLET | Freq: Every day | ORAL | Status: DC

## 2010-07-30 MED ORDER — LEVOTHYROXINE SODIUM 75 MCG PO TABS: 75.0000 ug | ORAL_TABLET | Freq: Every day | ORAL | Status: DC

## 2010-07-30 NOTE — Telephone Encounter (Signed)
Called pt son (moe) no ansew LMOM RTC concerning mom labs...07/30/10@11 :08am/LMB

## 2010-07-30 NOTE — Telephone Encounter (Signed)
Called pt again no ansew LMOM for son to give [place] call back...07/30/10@3 :43pm/LMB

## 2010-07-30 NOTE — Telephone Encounter (Signed)
Please call patient - normal results. No medication changes recommended.  90d med refills done.Thanks.   Lab Results  Component Value Date   WBC 8.9 07/29/2010   HGB 12.7 07/29/2010   HCT 38.5 07/29/2010   PLT 217.0 07/29/2010   CHOL 213* 07/29/2010   TRIG 127.0 07/29/2010   HDL 52.90 07/29/2010   LDLDIRECT 138.9 07/29/2010   ALT 12 07/29/2010   AST 15 07/29/2010   NA 135 07/29/2010   K 4.1 07/29/2010   CL 97 07/29/2010   CREATININE 0.6 07/29/2010   BUN 23 07/29/2010   CO2 30 07/29/2010   TSH 1.25 07/29/2010

## 2010-07-31 ENCOUNTER — Encounter: Payer: Self-pay | Admitting: *Deleted

## 2010-07-31 NOTE — Telephone Encounter (Signed)
Tried to call patient again still no ansew LMOM for son to return call back. Will mail out letter to patient concerning labs results...07/31/10@9 :35am

## 2010-07-31 NOTE — Telephone Encounter (Signed)
Noted ok. 

## 2010-08-04 LAB — COMPREHENSIVE METABOLIC PANEL
BUN: 13 mg/dL (ref 6–23)
CO2: 28 mEq/L (ref 19–32)
Calcium: 10.1 mg/dL (ref 8.4–10.5)
Creatinine, Ser: 0.59 mg/dL (ref 0.4–1.2)
GFR calc non Af Amer: >60 mL/min (ref >60)
Glucose, Bld: 133 mg/dL — ABNORMAL HIGH (ref 70–99)
Sodium: 139 mEq/L (ref 135–145)
Total Protein: 7.1 g/dL (ref 6.0–8.3)

## 2010-08-04 LAB — DIFFERENTIAL
Eosinophils Absolute: 0.1 10*3/uL (ref 0.0–0.7)
Lymphocytes Relative: 28 % (ref 12–46)
Lymphs Abs: 2.1 10*3/uL (ref 0.7–4.0)
Monocytes Relative: 8 % (ref 3–12)
Neutro Abs: 4.7 10*3/uL (ref 1.7–7.7)
Neutrophils Relative %: 62 % (ref 43–77)

## 2010-08-04 LAB — URINALYSIS, ROUTINE W REFLEX MICROSCOPIC
Bilirubin Urine: NEGATIVE
Nitrite: NEGATIVE
Specific Gravity, Urine: 1.021 (ref 1.005–1.030)
pH: 6.5 (ref 5.0–8.0)

## 2010-08-04 LAB — LIPASE, BLOOD: Lipase: 31 U/L (ref 11–59)

## 2010-08-04 LAB — CBC
HCT: 39.4 % (ref 36.0–46.0)
Hemoglobin: 13.2 g/dL (ref 12.0–15.0)
MCHC: 33.4 g/dL (ref 30.0–36.0)
MCV: 81.8 fL (ref 78.0–100.0)
RBC: 4.81 MIL/uL (ref 3.87–5.11)
RDW: 14.4 % (ref 11.5–15.5)

## 2010-08-04 LAB — URINE MICROSCOPIC-ADD ON

## 2010-08-06 ENCOUNTER — Emergency Department (HOSPITAL_COMMUNITY): Payer: Medicare Other

## 2010-08-06 ENCOUNTER — Emergency Department (HOSPITAL_COMMUNITY)
Admission: EM | Admit: 2010-08-06 | Discharge: 2010-08-06 | Disposition: A | Discharge status: Home / Self Care | Payer: Medicare Other | Attending: Emergency Medicine | Admitting: Emergency Medicine

## 2010-08-06 DIAGNOSIS — M545 Low back pain, unspecified: Secondary | ICD-10-CM | POA: Insufficient documentation

## 2010-08-06 DIAGNOSIS — R109 Unspecified abdominal pain: Secondary | ICD-10-CM | POA: Insufficient documentation

## 2010-08-06 DIAGNOSIS — I10 Essential (primary) hypertension: Secondary | ICD-10-CM | POA: Insufficient documentation

## 2010-08-06 DIAGNOSIS — M51379 Other intervertebral disc degeneration, lumbosacral region without mention of lumbar back pain or lower extremity pain: Secondary | ICD-10-CM | POA: Insufficient documentation

## 2010-08-06 DIAGNOSIS — M5137 Other intervertebral disc degeneration, lumbosacral region: Secondary | ICD-10-CM | POA: Insufficient documentation

## 2010-08-06 DIAGNOSIS — E7800 Pure hypercholesterolemia, unspecified: Secondary | ICD-10-CM | POA: Insufficient documentation

## 2010-08-06 DIAGNOSIS — M129 Arthropathy, unspecified: Secondary | ICD-10-CM | POA: Insufficient documentation

## 2010-08-06 DIAGNOSIS — Z79899 Other long term (current) drug therapy: Secondary | ICD-10-CM | POA: Insufficient documentation

## 2010-08-06 DIAGNOSIS — M79609 Pain in unspecified limb: Secondary | ICD-10-CM | POA: Insufficient documentation

## 2010-08-06 LAB — DIFFERENTIAL
Basophils Absolute: 0 10*3/uL (ref 0.0–0.1)
Basophils Relative: 0 % (ref 0–1)
Eosinophils Absolute: 0.1 10*3/uL (ref 0.0–0.7)
Monocytes Absolute: 0.7 10*3/uL (ref 0.1–1.0)
Monocytes Relative: 6 % (ref 3–12)
Neutro Abs: 8.7 10*3/uL — ABNORMAL HIGH (ref 1.7–7.7)
Neutrophils Relative %: 74 % (ref 43–77)

## 2010-08-06 LAB — URINALYSIS, ROUTINE W REFLEX MICROSCOPIC
Glucose, UA: NEGATIVE mg/dL
Ketones, ur: NEGATIVE mg/dL
Nitrite: NEGATIVE
pH: 6 (ref 5.0–8.0)

## 2010-08-06 LAB — BASIC METABOLIC PANEL
Calcium: 10.3 mg/dL (ref 8.4–10.5)
Creatinine, Ser: 0.79 mg/dL (ref 0.4–1.2)
GFR calc Af Amer: >60 mL/min (ref >60)
GFR calc non Af Amer: >60 mL/min (ref >60)
Sodium: 138 mEq/L (ref 135–145)

## 2010-08-06 LAB — CBC
MCH: 27.2 pg (ref 26.0–34.0)
MCHC: 32.7 g/dL (ref 30.0–36.0)
Platelets: 201 10*3/uL (ref 150–400)

## 2010-08-06 LAB — URINE MICROSCOPIC-ADD ON

## 2010-08-08 LAB — URINE CULTURE: Culture  Setup Time: 201204110401

## 2010-10-12 ENCOUNTER — Other Ambulatory Visit: Payer: Self-pay | Admitting: Internal Medicine

## 2010-12-19 ENCOUNTER — Ambulatory Visit (INDEPENDENT_AMBULATORY_CARE_PROVIDER_SITE_OTHER): Payer: Medicare Other | Admitting: Internal Medicine

## 2010-12-19 ENCOUNTER — Encounter: Payer: Self-pay | Admitting: Internal Medicine

## 2010-12-19 ENCOUNTER — Other Ambulatory Visit (INDEPENDENT_AMBULATORY_CARE_PROVIDER_SITE_OTHER): Payer: Medicare Other

## 2010-12-19 ENCOUNTER — Other Ambulatory Visit: Payer: Self-pay | Admitting: Internal Medicine

## 2010-12-19 VITALS — BP 130/92 | HR 61 | Temp 98.1°F | Ht 61.0 in | Wt 212.0 lb

## 2010-12-19 DIAGNOSIS — E785 Hyperlipidemia, unspecified: Secondary | ICD-10-CM

## 2010-12-19 DIAGNOSIS — I10 Essential (primary) hypertension: Secondary | ICD-10-CM

## 2010-12-19 DIAGNOSIS — E039 Hypothyroidism, unspecified: Secondary | ICD-10-CM

## 2010-12-19 DIAGNOSIS — E119 Type 2 diabetes mellitus without complications: Secondary | ICD-10-CM | POA: Insufficient documentation

## 2010-12-19 DIAGNOSIS — R109 Unspecified abdominal pain: Secondary | ICD-10-CM

## 2010-12-19 DIAGNOSIS — G8929 Other chronic pain: Secondary | ICD-10-CM

## 2010-12-19 LAB — CBC WITH DIFFERENTIAL/PLATELET
Basophils Absolute: 0 10*3/uL (ref 0.0–0.1)
HCT: 37.3 % (ref 36.0–46.0)
Hemoglobin: 12.2 g/dL (ref 12.0–15.0)
Lymphs Abs: 2 10*3/uL (ref 0.7–4.0)
MCV: 83.8 fl (ref 78.0–100.0)
Monocytes Relative: 5.7 % (ref 3.0–12.0)
Neutro Abs: 4.6 10*3/uL (ref 1.4–7.7)
RDW: 15.3 % — ABNORMAL HIGH (ref 11.5–14.6)

## 2010-12-19 LAB — LDL CHOLESTEROL, DIRECT: Direct LDL: 150.2 mg/dL

## 2010-12-19 LAB — URINALYSIS, ROUTINE W REFLEX MICROSCOPIC
Bilirubin Urine: NEGATIVE
Ketones, ur: NEGATIVE
Total Protein, Urine: NEGATIVE
pH: 6 (ref 5.0–8.0)

## 2010-12-19 LAB — HEPATIC FUNCTION PANEL
ALT: 11 U/L (ref 0–35)
Total Bilirubin: 0.6 mg/dL (ref 0.3–1.2)

## 2010-12-19 LAB — BASIC METABOLIC PANEL
CO2: 28 mEq/L (ref 19–32)
Chloride: 104 mEq/L (ref 96–112)
GFR: 68.5 mL/min (ref >60.00)
Glucose, Bld: 187 mg/dL — ABNORMAL HIGH (ref 70–99)
Potassium: 3.9 mEq/L (ref 3.5–5.1)
Sodium: 138 mEq/L (ref 135–145)

## 2010-12-19 LAB — LIPID PANEL
HDL: 43 mg/dL (ref >39.00)
Total CHOL/HDL Ratio: 5
Triglycerides: 158 mg/dL — ABNORMAL HIGH (ref 0.0–149.0)
VLDL: 31.6 mg/dL (ref 0.0–40.0)

## 2010-12-19 NOTE — Assessment & Plan Note (Signed)
off simvastatin since 12/2009 -  check labs and LFTs now - resume alt statin if needed

## 2010-12-19 NOTE — Assessment & Plan Note (Signed)
The current medical regimen is effective;  continue present plan and medications. BP Readings from Last 3 Encounters:  12/19/10 130/92  07/29/10 128/82  01/29/10 122/82

## 2010-12-19 NOTE — Assessment & Plan Note (Signed)
The current medical regimen is effective;  continue present plan and medications. Check lab now Lab Results  Component Value Date   TSH 1.25 07/29/2010

## 2010-12-19 NOTE — Patient Instructions (Signed)
It was good to see you today. Test(s) ordered today. Your results will be called to you after review (48-72hours after test completion). If any changes need to be made, you will be notified at that time. We may ask Dr. Charlett Blake to help [place] with your back causing the right side pain if your labs/urine here look ok Medications reviewed, no changes at this time. Please schedule followup in 6 months to monitor blood pressure and medications, call sooner if problems.

## 2010-12-19 NOTE — Progress Notes (Signed)
  Subjective:    Patient ID: Ashley Frederick, female    DOB: [birth year], 70 y.o.   MRN: [id number]  HPI  Here for R side pain Onset 5 months ago Denies precipitating injury - Pain worst sitting - better with standing/walking or lying down Not changed by activity, food, position or medications No nausea and vomiting, no fever, no rash and no dysuria/hematuria No weight change - no radiation of pain into chest, buttock or leg  Also reviewed chronic medical issues: Hypothyroid - the patient reports compliance with medication(s) as prescribed. Denies adverse side effects.  Dyslipidemia - previously on statin but holding since 12/2009   HTN - reports compliance with ongoing medical treatment and no changes in medication dose or frequency. denies adverse side effects related to current therapy, no CP or increase in HA symptoms, no weakness or increase edema  OA - bilateral knee pain, acute on chronic Ongoing synvisc shots in knees per ortho - s/p steroid shot as needed - improved overall denies new injury - occ swelling no OTC pain pill use or rx med use  Past Medical History  Diagnosis Date  . HYPOTHYROIDISM 03/12/2009  . DYSLIPIDEMIA 03/12/2009  . HYPERTENSION 03/12/2009  . OSTEOPOROSIS 03/12/2009  . OA (osteoarthritis) of knee    Review of Systems  Constitutional: Positive for fatigue. Negative for fever.  Respiratory: Negative for shortness of breath.   Cardiovascular: Negative for chest pain.       Objective:   Physical Exam  BP 130/92  Pulse 61  Temp(Src) 98.1 F (36.7 C) (Oral)  Ht 5\' 1"  (1.549 m)  Wt 212 lb (96.163 kg)  BMI 40.06 kg/m2  SpO2 95% Constitutional: She appears well-developed and well-nourished. No distress. Son at side (pt nonEnglish speaking) Neck: Thick, Normal range of motion. Neck supple. No JVD present. No thyromegaly present.  Cardiovascular: Normal rate, regular rhythm and normal heart sounds.  No murmur heard. chronic trace BLE edema/fatty  ankles Pulmonary/Chest: Effort normal and breath sounds normal. No respiratory distress. She has no wheezes.  Abd: obese but s, nt, nd - no mass, +BS - tender to deep palpation over R flank but with +myofascial spasm Neurological: She is alert and oriented to person, place, and time. No cranial nerve deficit. Coordination normal.  Skin: Skin is warm and dry. No rash noted. No erythema.  Psychiatric: She has a normal mood and affect. Her behavior is normal. Judgment and thought content normal.   Lab Results  Component Value Date   WBC 11.8* 08/06/2010   HGB 12.5 08/06/2010   HCT 38.2 08/06/2010   PLT 201 08/06/2010   CHOL 213* 07/29/2010   TRIG 127.0 07/29/2010   HDL 52.90 07/29/2010   LDLDIRECT 138.9 07/29/2010   ALT 12 07/29/2010   AST 15 07/29/2010   NA 138 08/06/2010   K 4.0 08/06/2010   CL 105 08/06/2010   CREATININE 0.79 08/06/2010   BUN 27* 08/06/2010   CO2 27 08/06/2010   TSH 1.25 07/29/2010      Assessment & Plan:  See problem list. Medications and labs reviewed today.  R flank pain: - suspect radicular given change in intensity with position/activity change - Prior L spine 07/2010 reviewed: DDD with progressive spur Check labs rule out medical cause Continue prn NSAIDs and follow up with ortho on same - sees [facility] for knees/synvisc - ?also eval back and consider ESI

## 2010-12-20 ENCOUNTER — Telehealth: Payer: Self-pay | Admitting: Internal Medicine

## 2010-12-20 DIAGNOSIS — E119 Type 2 diabetes mellitus without complications: Secondary | ICD-10-CM

## 2010-12-20 NOTE — Telephone Encounter (Signed)
Call to pt son re: new dx DM - rec to start metformin, son/pt would 1st like to try diet/lifestyle change for control without meds - pt/son will come in for nurse visit to get glucometer/supplies and demonstration of cbg monitoring (lucy, please help arrange and notify son of same) - also order done for nutrition refer

## 2010-12-20 NOTE — Telephone Encounter (Signed)
Called son Drue Stager) set up CBG demo for Tuesday 12/24/10 @ 1:30pm/LMB

## 2010-12-24 ENCOUNTER — Ambulatory Visit (INDEPENDENT_AMBULATORY_CARE_PROVIDER_SITE_OTHER): Payer: Medicare Other

## 2010-12-24 DIAGNOSIS — E119 Type 2 diabetes mellitus without complications: Secondary | ICD-10-CM

## 2010-12-24 MED ORDER — METFORMIN HCL ER 500 MG PO TB24: 500.0000 mg | ORAL_TABLET | Freq: Every day | ORAL | Status: DC

## 2010-12-24 MED ORDER — ONETOUCH DELICA LANCETS MISC: 1.0000 | Freq: Every day | Status: DC

## 2010-12-24 MED ORDER — GLUCOSE BLOOD VI STRP: ORAL_STRIP | Status: DC

## 2010-12-24 NOTE — Progress Notes (Signed)
CBG 140. Pt will call back in once week with results. Advised to check blood sugars once daily and vary the times of day they are checked. Pt understood directions and Rxs will be sent to pharmacy as requested.

## 2010-12-25 ENCOUNTER — Other Ambulatory Visit: Payer: Self-pay | Admitting: *Deleted

## 2010-12-25 MED ORDER — ONETOUCH DELICA LANCETS MISC: Status: DC

## 2010-12-25 MED ORDER — GLUCOSE BLOOD VI STRP: ORAL_STRIP | Status: DC

## 2010-12-25 NOTE — Telephone Encounter (Signed)
Received fax stating need new rx fro one touch strips & lancets with dx code exactly how many time pt is testing a day. Resending rx

## 2011-01-28 ENCOUNTER — Encounter: Payer: Medicare Other | Attending: Internal Medicine | Admitting: *Deleted

## 2011-01-28 ENCOUNTER — Encounter: Payer: Self-pay | Admitting: *Deleted

## 2011-01-28 DIAGNOSIS — I10 Essential (primary) hypertension: Secondary | ICD-10-CM

## 2011-01-28 DIAGNOSIS — Z713 Dietary counseling and surveillance: Secondary | ICD-10-CM | POA: Insufficient documentation

## 2011-01-28 DIAGNOSIS — E119 Type 2 diabetes mellitus without complications: Secondary | ICD-10-CM

## 2011-01-28 NOTE — Progress Notes (Signed)
  Medical Nutrition Therapy:  Appt start time: 1500 end time:  1600.   Assessment:  Primary concerns today: Newly diagnosed Diabetes, Type 2, HTN.   MEDICATIONS: see medication list. Diabetes medication is Metformin 500 mg 1/day   DIETARY INTAKE:  Usual eating pattern includes 2 meals and 1 snack per day.  Everyday foods include good variety of all food groups.  Avoided foods include alcohol, fried foods, sweets.    24-hr recall:  B ( AM): 2 pieces  plain toast, cheese or boiled egg, sliced tomato, hot tea Snk ( AM): none  L ( PM): mostly vegetables, 1 starch food, lean meat occasionally, Snk ( PM): none D ( PM): none Snk ( PM): 1 cup milk, occasionally fruit Beverages: water all day, hot tea, occasionally milk  Usual physical activity: walks at mall 45 minutes 2-3 times per week  Estimated energy needs: 1200-1400 calories 145 g carbohydrates 95 g protein 35 g fat  Progress Towards Goal(s):  In progress.   Nutritional Diagnosis:  NI-5.8.4 Inconsistent carbohydrate intake As related to BG variability.  As evidenced by Log book.    Intervention:  Nutrition education on Carb Counting. Taught thru son as interpreter. Also covered Type 2 diabetes, HgA1c, self monitoring of BG daily and provided new lancing device, and benefits of exercise.  Handouts given during visit include:  Living Well with Diabetes  Low Sodium handout in regard to diagnosis of HTN  Monitoring/Evaluation:  Dietary intake, exercise, self monitoring of BG, and body weight prn.

## 2011-02-12 ENCOUNTER — Telehealth: Payer: Self-pay | Admitting: *Deleted

## 2011-02-12 ENCOUNTER — Ambulatory Visit (INDEPENDENT_AMBULATORY_CARE_PROVIDER_SITE_OTHER): Payer: Medicare Other | Admitting: *Deleted

## 2011-02-12 DIAGNOSIS — Z23 Encounter for immunization: Secondary | ICD-10-CM

## 2011-02-12 NOTE — Telephone Encounter (Signed)
Pt came in to get flu shot. They check her BS it was ok. Gave her another monitor...02/12/11@4 :40pm/LMB

## 2011-02-12 NOTE — Telephone Encounter (Signed)
Son left msg on vm mother glucose meter is not working. Keep reading error. Called pt son Drue Stager) back no answer left msg on vm to RTC ASAP....02/12/11@11 :04am/LMB

## 2011-04-19 ENCOUNTER — Other Ambulatory Visit: Payer: Self-pay | Admitting: Internal Medicine

## 2011-05-14 ENCOUNTER — Other Ambulatory Visit (INDEPENDENT_AMBULATORY_CARE_PROVIDER_SITE_OTHER): Payer: Medicare Other

## 2011-05-14 ENCOUNTER — Encounter: Payer: Self-pay | Admitting: Internal Medicine

## 2011-05-14 ENCOUNTER — Ambulatory Visit (INDEPENDENT_AMBULATORY_CARE_PROVIDER_SITE_OTHER): Payer: Medicare Other | Admitting: Internal Medicine

## 2011-05-14 DIAGNOSIS — E119 Type 2 diabetes mellitus without complications: Secondary | ICD-10-CM

## 2011-05-14 DIAGNOSIS — M6530 Trigger finger, unspecified finger: Secondary | ICD-10-CM

## 2011-05-14 DIAGNOSIS — R5383 Other fatigue: Secondary | ICD-10-CM

## 2011-05-14 DIAGNOSIS — E039 Hypothyroidism, unspecified: Secondary | ICD-10-CM

## 2011-05-14 DIAGNOSIS — R200 Anesthesia of skin: Secondary | ICD-10-CM

## 2011-05-14 DIAGNOSIS — R209 Unspecified disturbances of skin sensation: Secondary | ICD-10-CM

## 2011-05-14 DIAGNOSIS — R5381 Other malaise: Secondary | ICD-10-CM

## 2011-05-14 DIAGNOSIS — Z79899 Other long term (current) drug therapy: Secondary | ICD-10-CM

## 2011-05-14 DIAGNOSIS — R202 Paresthesia of skin: Secondary | ICD-10-CM

## 2011-05-14 DIAGNOSIS — M65342 Trigger finger, left ring finger: Secondary | ICD-10-CM

## 2011-05-14 LAB — BASIC METABOLIC PANEL
BUN: 21 mg/dL (ref 6–23)
Chloride: 101 mEq/L (ref 96–112)
Creatinine, Ser: 0.8 mg/dL (ref 0.4–1.2)
Glucose, Bld: 108 mg/dL — ABNORMAL HIGH (ref 70–99)

## 2011-05-14 LAB — HEMOGLOBIN A1C: Hgb A1c MFr Bld: 6.9 % — ABNORMAL HIGH (ref 4.6–6.5)

## 2011-05-14 LAB — CBC WITH DIFFERENTIAL/PLATELET
Basophils Absolute: 0 10*3/uL (ref 0.0–0.1)
Eosinophils Absolute: 0.2 10*3/uL (ref 0.0–0.7)
Eosinophils Relative: 2.8 % (ref 0.0–5.0)
MCHC: 33.5 g/dL (ref 30.0–36.0)
MCV: 83.9 fl (ref 78.0–100.0)
Monocytes Absolute: 0.5 10*3/uL (ref 0.1–1.0)
Neutrophils Relative %: 58.8 % (ref 43.0–77.0)
Platelets: 198 10*3/uL (ref 150.0–400.0)
RDW: 14.5 % (ref 11.5–14.6)
WBC: 7.3 10*3/uL (ref 4.5–10.5)

## 2011-05-14 LAB — HEPATIC FUNCTION PANEL
ALT: 12 U/L (ref 0–35)
Alkaline Phosphatase: 77 U/L (ref 39–117)
Bilirubin, Direct: 0.1 mg/dL (ref 0.0–0.3)
Total Protein: 7.2 g/dL (ref 6.0–8.3)

## 2011-05-14 MED ORDER — MELOXICAM 15 MG PO TABS: 15.0000 mg | ORAL_TABLET | Freq: Every day | ORAL | Status: DC

## 2011-05-14 NOTE — Assessment & Plan Note (Signed)
The current medical regimen is effective;  continue present plan and medications. Check lab now Lab Results  Component Value Date   TSH 5.71* 12/19/2010

## 2011-05-14 NOTE — Progress Notes (Signed)
  Subjective:    Patient ID: Ashley Frederick, female    DOB: 03/07/1941, 71 y.o.   MRN: [id number]  HPI  Here for follow up - reviewed chronic medical issues:  DM2 - dx 11/2010 by labs - started metformin - the patient reports compliance with medication(s) as prescribed. Denies adverse side effects.  Hypothyroid - the patient reports compliance with medication(s) as prescribed. Denies adverse side effects.  Dyslipidemia - previously on statin but holding since 12/2009   HTN - reports compliance with ongoing medical treatment and no changes in medication dose or frequency. denies adverse side effects related to current therapy, no chest pain or increase in headache symptoms, no weakness or increase edema  OA - bilateral knee pain, acute on chronic Ongoing synvisc shots in knees per ortho - s/p steroid shot as needed - improved overall denies new injury - occ swelling no OTC pain pill use or rx med use  complains of pain palm of L ring finger - no injury - no popping -  Onset 6-8 weeks ago - no swelling or bruising  Past Medical History  Diagnosis Date  . HYPOTHYROIDISM   . DYSLIPIDEMIA   . HYPERTENSION   . OSTEOPOROSIS   . OA (osteoarthritis) of knee   . Diabetes mellitus, type 2    Review of Systems  Constitutional: Positive for fatigue. Negative for fever.  Respiratory: Negative for shortness of breath.   Cardiovascular: Negative for chest pain.  Neurological: Positive for headaches (occasional diffuse 2-3x/week - relieved with Advil prn).       Objective:   Physical Exam  BP 120/86  Pulse 58  Temp(Src) 97.3 F (36.3 C) (Oral)  Wt 202 lb 12.8 oz (91.989 kg)  SpO2 97% Wt Readings from Last 3 Encounters:  05/14/11 202 lb 12.8 oz (91.989 kg)  01/28/11 208 lb 3.2 oz (94.439 kg)  12/19/10 212 lb (96.163 kg)   Constitutional: She appears well-developed and well-nourished. No distress. Son at side (pt nonEnglish speaking) Neck: Thick, Normal range of motion. Neck supple.  No JVD present. No thyromegaly present.  Cardiovascular: Normal rate, regular rhythm and normal heart sounds.  No murmur heard. chronic trace BLE edema/fatty ankles Pulmonary/Chest: Effort normal and breath sounds normal. No respiratory distress. She has no wheezes. Mskel - L 4th finger with mild flexor tendon pain at MCP region consistent with trigger finger - no locking or popping  Psychiatric: She has a normal mood and affect. Her behavior is normal. Judgment and thought content normal.   Lab Results  Component Value Date   WBC 7.1 12/19/2010   HGB 12.2 12/19/2010   HCT 37.3 12/19/2010   PLT 215.0 12/19/2010   CHOL 207* 12/19/2010   TRIG 158.0* 12/19/2010   HDL 43.00 12/19/2010   LDLDIRECT 150.2 12/19/2010   ALT 11 12/19/2010   AST 17 12/19/2010   NA 138 12/19/2010   K 3.9 12/19/2010   CL 104 12/19/2010   CREATININE 0.9 12/19/2010   BUN 26* 12/19/2010   CO2 28 12/19/2010   TSH 5.71* 12/19/2010   HGBA1C 7.6* 12/19/2010      Assessment & Plan:  See problem list. Medications and labs reviewed today.  Fatigue- poor sleep, ?depression - nonspecific hx and exam - check labs including B12 given hand symptoms   Trigger finger - education on dx provided - try NSAIDs - home splinting and call if worse or unimproved to consider steroid injection prn

## 2011-05-14 NOTE — Assessment & Plan Note (Signed)
Same dx 11/2010 - started metformin Check a1c now and adjust as needed Lab Results  Component Value Date   HGBA1C 7.6* 12/19/2010

## 2011-05-14 NOTE — Patient Instructions (Signed)
It was good to see you today. Test(s) ordered today. Your results will be called to you after review (48-72hours after test completion). If any changes need to be made, you will be notified at that time. Your hand/finger pain is called "trigger finger" - see details below - if unimproved with meloxicam, call me or Dr. Charlett Blake for injection as discussed Use meloxicam for headache symptoms and finger pain - take daily for 7 days, then as needed - Your prescription(s) have been submitted to your pharmacy. Please take as directed and contact our office if you believe you are having problem(s) with the medication(s). Other Medications reviewed, no additional changes at this time.  Please schedule followup in 4 months for diabetes mellitus check, call sooner if problems.  Trigger Finger Trigger finger (digital tendinitis and stenosing tenosynovitis) is a common disorder that causes an often painful catching of the fingers or thumb. It occurs as a clicking, snapping or locking of a finger in the palm of the hand. The reason for this is that there is a problem with the tendons which flex the fingers sliding smoothly through their sheaths. The cause of this may be inflammation of the tendon and sheath, or from a thickening or nodule in the tendon. The condition may occur in any finger or a couple fingers at the same time. The cause may be overuse while doing the same activity over and over again with your hands.   Tendons are the tough cords that connect the muscles to bones. Muscles and tendons are part of the system which allows your body to move. When muscles contract in the forearm on the palm side, they pull the tendons toward the elbow and cause the fingers and thumb to bend (flex) toward the palm. These are the flexor tendons. The tendons slide through a slippery smooth membrane (synovium) which is called the tendon sheath. The sheaths have areas of tough fibrous tissues surrounding them which hold the tendons  close to the bone. These are called pulleys because they work like a pulley. The first pulley is in the palm of the hand near the crease which runs across your palm. If the area of the tendon thickening is near the pulley, the tendon cannot slide smoothly through the pulley and this causes the trigger finger. The finger may lock with the finger curled or suddenly straighten out with a snap. This is more common in patients with rheumatoid arthritis and diabetes. Left untreated, the condition may get worse to the point where the finger becomes locked in flexion, like making a fist, or less commonly locked with the finger straightened out. DIAGNOSIS   Your caregiver will easily make this diagnosis on examination. TREATMENT    Splinting for 6 to 8 weeks of time may be helpful. Use the splints as your caregiver suggests.     Heat used for twenty minutes at least four times a day followed by ice packs for twenty minutes unless directed otherwise by your caregiver may be helpful. If you find either heat or cold seems to be making the problem worse, quit using them and ask your caregiver for directions.     Cortisone injections along with splinting may speed up recovery. Several injections may be required. Cortisone may give relief after one injection.     Only take over-the-counter or prescription medicines for pain, discomfort, or fever as directed by your caregiver.     Surgery is another treatment that may be used if conservative treatments using  injection and splinting does not work. Surgery can be minor without incisions (a cut does not have to be made) and can be done with a needle through the skin. No stitches are needed and most patients may return to work the same day.     Other surgical choices involve an open procedure where the surgeon opens the hand through a small incision (cut) and cuts the pulley so the tendon can again slide smoothly. Your hand will still work fine. This small operation  requires stitches and the recovery will be a little longer and the incisions will need to be protected until completely healed. You may have to limit your activities for up to 6 months.     Occupational or hand therapy may be required if there is stiffness remaining in the finger.  RISKS AND COMPLICATIONS Complications are uncommon but some problems that may occur are:  Recurrence of the trigger finger. This does not mean that the surgery was not well done. It simply means that you may have formed scar tissue following surgery that causes the problem to reoccur.     Infection which could ruin the results of the surgery and can result in a finger which is frozen and can not move normally.     Nerve injury is possible which could result in permanent numbness of one or more fingers.  CARE AFTER SURGERY  Elevate your hand above your heart and use ice as instructed.     Follow instructions regarding finger motion/exercise.     Keep the surgical wound dry for at least 48 hrs or longer if instructed.     Keep your follow-up appointments.     Return to work and normal activities as instructed.  SEEK IMMEDIATE MEDICAL CARE IF:   Your problems are getting worse or you do not obtain relief from the treatment. Document Released: 02/02/2004 Document Revised: 12/25/2010 Document Reviewed: 09/26/2008 [facility] Patient Information 2012 [place], [place].

## 2011-05-15 ENCOUNTER — Encounter: Payer: Self-pay | Admitting: Internal Medicine

## 2011-05-15 DIAGNOSIS — E538 Deficiency of other specified B group vitamins: Secondary | ICD-10-CM | POA: Insufficient documentation

## 2011-05-15 LAB — VITAMIN B12: Vitamin B-12: 81 pg/mL — ABNORMAL LOW (ref 211–911)

## 2011-05-16 ENCOUNTER — Ambulatory Visit (INDEPENDENT_AMBULATORY_CARE_PROVIDER_SITE_OTHER): Payer: Medicare Other

## 2011-05-16 DIAGNOSIS — E538 Deficiency of other specified B group vitamins: Secondary | ICD-10-CM

## 2011-05-16 MED ORDER — CYANOCOBALAMIN 1000 MCG/ML IJ SOLN
1000.0000 ug | Freq: Once | INTRAMUSCULAR | Status: AC
  Administered 2011-05-16: 1000 ug via INTRAMUSCULAR

## 2011-06-16 ENCOUNTER — Ambulatory Visit (INDEPENDENT_AMBULATORY_CARE_PROVIDER_SITE_OTHER): Payer: Medicare Other | Admitting: *Deleted

## 2011-06-16 DIAGNOSIS — E538 Deficiency of other specified B group vitamins: Secondary | ICD-10-CM

## 2011-06-16 MED ORDER — CYANOCOBALAMIN 1000 MCG/ML IJ SOLN
1000.0000 ug | Freq: Once | INTRAMUSCULAR | Status: AC
  Administered 2011-06-16: 1000 ug via INTRAMUSCULAR

## 2011-07-04 ENCOUNTER — Encounter: Payer: Self-pay | Admitting: Internal Medicine

## 2011-07-04 ENCOUNTER — Ambulatory Visit (INDEPENDENT_AMBULATORY_CARE_PROVIDER_SITE_OTHER): Payer: Medicare Other | Admitting: Internal Medicine

## 2011-07-04 ENCOUNTER — Other Ambulatory Visit: Payer: Self-pay | Admitting: Internal Medicine

## 2011-07-04 VITALS — BP 120/82 | HR 66 | Temp 98.1°F | Ht 62.0 in | Wt 202.6 lb

## 2011-07-04 DIAGNOSIS — L259 Unspecified contact dermatitis, unspecified cause: Secondary | ICD-10-CM

## 2011-07-04 DIAGNOSIS — L309 Dermatitis, unspecified: Secondary | ICD-10-CM

## 2011-07-04 MED ORDER — METHYLPREDNISOLONE ACETATE 80 MG/ML IJ SUSP
80.0000 mg | Freq: Once | INTRAMUSCULAR | Status: AC
  Administered 2011-07-04: 80 mg via INTRAMUSCULAR

## 2011-07-04 NOTE — Patient Instructions (Signed)
It was good to see you today. Medrol shot given today for your rash - Use moisturizing lotion to your skin twice daily, especially after bathing - it is important to avoid dry skin while treating this rash

## 2011-07-04 NOTE — Progress Notes (Signed)
  Subjective:    Patient ID: Ashley Frederick, female    DOB: June 28, 1940, 71 y.o.   MRN: [id number]  HPI  complains of rash - Located posterior knee, wrist L and anterior chest Onset 36h ago - wax/wane Denies new contact exposure or new foods  Past Medical History  Diagnosis Date  . HYPOTHYROIDISM   . DYSLIPIDEMIA   . HYPERTENSION   . OSTEOPOROSIS   . OA (osteoarthritis) of knee   . Diabetes mellitus, type 2     Review of Systems  HENT: Negative for drooling.        No angioedema  Respiratory: Negative for shortness of breath and stridor.        Objective:   Physical Exam BP 120/82  Pulse 66  Temp(Src) 98.1 F (36.7 C) (Oral)  Ht 5\' 2"  (1.575 m)  Wt 202 lb 9.6 oz (91.899 kg)  BMI 37.06 kg/m2  SpO2 94% Gen: NAD Skin: Eczema changes anterior chest - also left posterior knee and left wrist flexor surface  Lab Results  Component Value Date   WBC 7.3 05/14/2011   HGB 12.7 05/14/2011   HCT 37.9 05/14/2011   PLT 198.0 05/14/2011   GLUCOSE 108* 05/14/2011   CHOL 207* 12/19/2010   TRIG 158.0* 12/19/2010   HDL 43.00 12/19/2010   LDLDIRECT 150.2 12/19/2010   LDLCALC 73 06/06/2009   ALT 12 05/14/2011   AST 16 05/14/2011   NA 138 05/14/2011   K 3.8 05/14/2011   CL 101 05/14/2011   CREATININE 0.8 05/14/2011   BUN 21 05/14/2011   CO2 28 05/14/2011   TSH 2.31 05/14/2011   HGBA1C 6.9* 05/14/2011       Assessment & Plan:  eczema -  dry skin dermatitis - treat IM medrol and advised Mositurizing lotion after bathing

## 2011-07-14 ENCOUNTER — Ambulatory Visit (INDEPENDENT_AMBULATORY_CARE_PROVIDER_SITE_OTHER): Payer: Medicare Other | Admitting: *Deleted

## 2011-07-14 DIAGNOSIS — E538 Deficiency of other specified B group vitamins: Secondary | ICD-10-CM

## 2011-07-14 MED ORDER — CYANOCOBALAMIN 1000 MCG/ML IJ SOLN
1000.0000 ug | Freq: Once | INTRAMUSCULAR | Status: AC
  Administered 2011-07-14: 1000 ug via INTRAMUSCULAR

## 2011-08-06 ENCOUNTER — Ambulatory Visit (INDEPENDENT_AMBULATORY_CARE_PROVIDER_SITE_OTHER): Payer: Medicare Other | Admitting: *Deleted

## 2011-08-06 DIAGNOSIS — E538 Deficiency of other specified B group vitamins: Secondary | ICD-10-CM

## 2011-08-06 MED ORDER — CYANOCOBALAMIN 1000 MCG/ML IJ SOLN
1000.0000 ug | Freq: Once | INTRAMUSCULAR | Status: AC
  Administered 2011-08-06: 1000 ug via INTRAMUSCULAR

## 2011-09-05 ENCOUNTER — Ambulatory Visit: Payer: Medicare Other

## 2011-10-14 ENCOUNTER — Other Ambulatory Visit: Payer: Self-pay

## 2011-10-14 MED ORDER — GLUCOSE BLOOD VI STRP: ORAL_STRIP | Status: DC

## 2011-10-26 ENCOUNTER — Other Ambulatory Visit: Payer: Self-pay | Admitting: Internal Medicine

## 2012-02-16 ENCOUNTER — Other Ambulatory Visit: Payer: Self-pay | Admitting: Internal Medicine

## 2012-02-26 ENCOUNTER — Ambulatory Visit (INDEPENDENT_AMBULATORY_CARE_PROVIDER_SITE_OTHER): Payer: Medicare Other | Admitting: Internal Medicine

## 2012-02-26 ENCOUNTER — Other Ambulatory Visit (INDEPENDENT_AMBULATORY_CARE_PROVIDER_SITE_OTHER): Payer: Medicare Other

## 2012-02-26 ENCOUNTER — Encounter: Payer: Self-pay | Admitting: Internal Medicine

## 2012-02-26 VITALS — BP 130/84 | HR 58 | Temp 98.0°F | Ht 62.0 in | Wt 207.4 lb

## 2012-02-26 DIAGNOSIS — E039 Hypothyroidism, unspecified: Secondary | ICD-10-CM

## 2012-02-26 DIAGNOSIS — I10 Essential (primary) hypertension: Secondary | ICD-10-CM

## 2012-02-26 DIAGNOSIS — E119 Type 2 diabetes mellitus without complications: Secondary | ICD-10-CM

## 2012-02-26 DIAGNOSIS — Z23 Encounter for immunization: Secondary | ICD-10-CM

## 2012-02-26 DIAGNOSIS — E785 Hyperlipidemia, unspecified: Secondary | ICD-10-CM

## 2012-02-26 LAB — HEMOGLOBIN A1C: Hgb A1c MFr Bld: 7.2 % — ABNORMAL HIGH (ref 4.6–6.5)

## 2012-02-26 LAB — TSH: TSH: 2.81 u[IU]/mL (ref 0.35–5.50)

## 2012-02-26 LAB — LIPID PANEL
HDL: 38.1 mg/dL — ABNORMAL LOW (ref >39.00)
Triglycerides: 142 mg/dL (ref 0.0–149.0)

## 2012-02-26 LAB — LDL CHOLESTEROL, DIRECT: Direct LDL: 164.4 mg/dL

## 2012-02-26 LAB — CREATININE, SERUM: Creatinine, Ser: 0.8 mg/dL (ref 0.4–1.2)

## 2012-02-26 MED ORDER — GLUCOSE BLOOD VI STRP: ORAL_STRIP | Status: DC

## 2012-02-26 MED ORDER — ONETOUCH DELICA LANCETS MISC: Status: DC

## 2012-02-26 NOTE — Assessment & Plan Note (Signed)
Same dx 11/2010 - started metformin Check a1c now and adjust as needed The patient is asked to make an attempt to improve diet and exercise patterns to aid in medical management of this problem.  Lab Results  Component Value Date   HGBA1C 6.9* 05/14/2011

## 2012-02-26 NOTE — Assessment & Plan Note (Signed)
The current medical regimen is effective;  continue present plan and medications. BP Readings from Last 3 Encounters:  02/26/12 130/84  07/04/11 120/82  05/14/11 120/86

## 2012-02-26 NOTE — Assessment & Plan Note (Signed)
The current medical regimen is effective;  continue present plan and medications. Check lab now Lab Results  Component Value Date   TSH 2.31 05/14/2011

## 2012-02-26 NOTE — Patient Instructions (Addendum)
It was good to see you today. We have reviewed your prior records including labs and tests today Test(s) ordered today. Your results will be released to MyChart (or called to you) after review, usually within 72hours after test completion. If any changes need to be made, you will be notified at that same time. Medications reviewed, no changes at this time. we'll make referral to eye specialist for eye check . Our office will contact you regarding appointment(s) once made. Please schedule followup in 6 months, call sooner if problems. Flu shot today

## 2012-02-26 NOTE — Progress Notes (Signed)
  Subjective:    Patient ID: Ashley Frederick, female    DOB: 12/29/1940, 71 y.o.   MRN: [id number]  HPI  Here for follow up - reviewed chronic medical issues:  DM2 - dx 11/2010 - started metformin - the patient reports compliance with medication(s) as prescribed. Denies adverse side effects.  Hypothyroid - the patient reports compliance with medication(s) as prescribed. Denies adverse side effects.  Dyslipidemia - previously on statin but stopped same 12/2009 -prefers not to resume because of myalgia side effects  Hypertension - reports compliance with ongoing medical treatment and no changes in medication dose or frequency. denies adverse side effects related to current therapy, no chest pain or increase in headache symptoms, no weakness or increase edema  OA - bilateral knee pain, acute on chronic Ongoing synvisc shots in both knees per ortho - Also s/p steroid shot as needed - improved overall denies new injury or new swelling no OTC pain pill use or rx med use   Past Medical History  Diagnosis Date  . HYPOTHYROIDISM   . DYSLIPIDEMIA   . HYPERTENSION   . OSTEOPOROSIS   . OA (osteoarthritis) of knee   . Diabetes mellitus, type 2    Review of Systems  Constitutional: Positive for fatigue. Negative for fever.  Respiratory: Negative for cough and shortness of breath.   Cardiovascular: Negative for chest pain and palpitations.       Objective:   Physical Exam  BP 130/84  Pulse 58  Temp 98 F (36.7 C) (Oral)  Ht 5\' 2"  (1.575 m)  Wt 207 lb 6.4 oz (94.076 kg)  BMI 37.93 kg/m2  SpO2 93% Wt Readings from Last 3 Encounters:  02/26/12 207 lb 6.4 oz (94.076 kg)  07/04/11 202 lb 9.6 oz (91.899 kg)  05/14/11 202 lb 12.8 oz (91.989 kg)   Constitutional: She appears well-developed and well-nourished. No distress. translator at side Neck: Thick, Normal range of motion. Neck supple. No JVD present. No thyromegaly present.  Cardiovascular: Normal rate, regular rhythm and normal  heart sounds.  No murmur heard. chronic trace BLE edema/fatty ankles Pulmonary/Chest: Effort normal and breath sounds normal. No respiratory distress. She has no wheezes. Mskel - B knees - boggy synovitis - tender to palpation over joint line; FROM and ligamentous function intact Psychiatric: She has a normal mood and affect. Her behavior is normal. Judgment and thought content normal.   Lab Results  Component Value Date   WBC 7.3 05/14/2011   HGB 12.7 05/14/2011   HCT 37.9 05/14/2011   PLT 198.0 05/14/2011   CHOL 207* 12/19/2010   TRIG 158.0* 12/19/2010   HDL 43.00 12/19/2010   LDLDIRECT 150.2 12/19/2010   ALT 12 05/14/2011   AST 16 05/14/2011   NA 138 05/14/2011   K 3.8 05/14/2011   CL 101 05/14/2011   CREATININE 0.8 05/14/2011   BUN 21 05/14/2011   CO2 28 05/14/2011   TSH 2.31 05/14/2011   HGBA1C 6.9* 05/14/2011      Assessment & Plan:  See problem list. Medications and labs reviewed today.

## 2012-03-24 ENCOUNTER — Other Ambulatory Visit: Payer: Self-pay | Admitting: Internal Medicine

## 2012-04-03 ENCOUNTER — Encounter (HOSPITAL_COMMUNITY): Payer: Self-pay | Admitting: *Deleted

## 2012-04-03 ENCOUNTER — Emergency Department (HOSPITAL_COMMUNITY): Payer: Medicare Other

## 2012-04-03 ENCOUNTER — Emergency Department (HOSPITAL_COMMUNITY)
Admission: EM | Admit: 2012-04-03 | Discharge: 2012-04-04 | Disposition: A | Discharge status: Home / Self Care | Payer: Medicare Other | Attending: Emergency Medicine | Admitting: Emergency Medicine

## 2012-04-03 DIAGNOSIS — Z79899 Other long term (current) drug therapy: Secondary | ICD-10-CM | POA: Insufficient documentation

## 2012-04-03 DIAGNOSIS — Z7982 Long term (current) use of aspirin: Secondary | ICD-10-CM | POA: Insufficient documentation

## 2012-04-03 DIAGNOSIS — M810 Age-related osteoporosis without current pathological fracture: Secondary | ICD-10-CM | POA: Insufficient documentation

## 2012-04-03 DIAGNOSIS — M7989 Other specified soft tissue disorders: Secondary | ICD-10-CM | POA: Insufficient documentation

## 2012-04-03 DIAGNOSIS — J029 Acute pharyngitis, unspecified: Secondary | ICD-10-CM | POA: Insufficient documentation

## 2012-04-03 DIAGNOSIS — E039 Hypothyroidism, unspecified: Secondary | ICD-10-CM | POA: Insufficient documentation

## 2012-04-03 DIAGNOSIS — E119 Type 2 diabetes mellitus without complications: Secondary | ICD-10-CM | POA: Insufficient documentation

## 2012-04-03 DIAGNOSIS — R51 Headache: Secondary | ICD-10-CM

## 2012-04-03 DIAGNOSIS — M1710 Unilateral primary osteoarthritis, unspecified knee: Secondary | ICD-10-CM | POA: Insufficient documentation

## 2012-04-03 DIAGNOSIS — E785 Hyperlipidemia, unspecified: Secondary | ICD-10-CM | POA: Insufficient documentation

## 2012-04-03 DIAGNOSIS — IMO0002 Reserved for concepts with insufficient information to code with codable children: Secondary | ICD-10-CM | POA: Insufficient documentation

## 2012-04-03 DIAGNOSIS — I10 Essential (primary) hypertension: Secondary | ICD-10-CM | POA: Insufficient documentation

## 2012-04-03 LAB — POCT I-STAT TROPONIN I: Troponin i, poc: 0.01 ng/mL (ref 0.00–0.08)

## 2012-04-03 LAB — URINALYSIS, ROUTINE W REFLEX MICROSCOPIC
Glucose, UA: NEGATIVE mg/dL
Protein, ur: 30 mg/dL — AB
pH: 7 (ref 5.0–8.0)

## 2012-04-03 LAB — CBC WITH DIFFERENTIAL/PLATELET
Basophils Absolute: 0 10*3/uL (ref 0.0–0.1)
Basophils Relative: 0 % (ref 0–1)
Eosinophils Absolute: 0.1 10*3/uL (ref 0.0–0.7)
Eosinophils Relative: 1 % (ref 0–5)
HCT: 39.5 % (ref 36.0–46.0)
Hemoglobin: 12.8 g/dL (ref 12.0–15.0)
MCH: 26.8 pg (ref 26.0–34.0)
MCHC: 32.4 g/dL (ref 30.0–36.0)
MCV: 82.8 fL (ref 78.0–100.0)
Monocytes Absolute: 0.6 10*3/uL (ref 0.1–1.0)
Monocytes Relative: 6 % (ref 3–12)
RDW: 13.6 % (ref 11.5–15.5)

## 2012-04-03 LAB — COMPREHENSIVE METABOLIC PANEL
Albumin: 3.7 g/dL (ref 3.5–5.2)
BUN: 22 mg/dL (ref 6–23)
Calcium: 10.9 mg/dL — ABNORMAL HIGH (ref 8.4–10.5)
Chloride: 102 mEq/L (ref 96–112)
Creatinine, Ser: 0.9 mg/dL (ref 0.50–1.10)
Total Bilirubin: 0.2 mg/dL — ABNORMAL LOW (ref 0.3–1.2)
Total Protein: 7.3 g/dL (ref 6.0–8.3)

## 2012-04-03 LAB — URINE MICROSCOPIC-ADD ON

## 2012-04-03 MED ORDER — ACETAMINOPHEN 325 MG PO TABS
650.0000 mg | ORAL_TABLET | Freq: Once | ORAL | Status: AC
  Administered 2012-04-03: 650 mg via ORAL
  Filled 2012-04-03: qty 2

## 2012-04-03 MED ORDER — CLONIDINE HCL 0.1 MG PO TABS
0.1000 mg | ORAL_TABLET | Freq: Once | ORAL | Status: AC
  Administered 2012-04-03: 0.1 mg via ORAL
  Filled 2012-04-03: qty 1

## 2012-04-03 NOTE — ED Provider Notes (Signed)
History     CSN: [id number]  Arrival date & time 04/03/12  1908   First MD Initiated Contact with Patient 04/03/12 2058      Chief Complaint  Patient presents with  . Hypertension    (Consider location/radiation/quality/duration/timing/severity/associated sxs/prior treatment) HPI Ashley Frederick is a 71 y.o. female who presents to ED with complaint of headache since yesterday, elevated blood pressure. Per Son, pt has been complaining of a headache since yesterday. Denies sensitivity to light, nausea, vomiting, blurred vision, chest pain, SOB, abdominal pain. Took advil which has helped. States this morning, son measured her BP and it was 200/110. Pt took er medications which did not lower the BP, he went to work, when got back this evening, he rechecked her BP, and states it was still 200/100. States he brought her here for evaluation.  Past Medical History  Diagnosis Date  . HYPOTHYROIDISM   . DYSLIPIDEMIA   . HYPERTENSION   . OSTEOPOROSIS   . OA (osteoarthritis) of knee   . Diabetes mellitus, type 2     Past Surgical History  Procedure Date  . No past surgeries     No family history on file.  History  Substance Use Topics  . Smoking status: Never Smoker   . Smokeless tobacco: Not on file     Comment: [place]oved [place] [facility] to be closer to family- lives with son  . Alcohol Use: No    OB History    Grav Para Term Preterm Abortions TAB SAB Ect Mult Living                  Review of Systems  Constitutional: Negative for fever, chills and diaphoresis.  HENT: Negative for ear pain, congestion, sore throat, facial swelling, neck pain and neck stiffness.   Eyes: Negative for photophobia, pain and visual disturbance.  Respiratory: Negative for chest tightness and shortness of breath.   Cardiovascular: Positive for leg swelling. Negative for chest pain.  Gastrointestinal: Negative for nausea, vomiting and abdominal pain.  Genitourinary: Negative for dysuria and flank pain.   Musculoskeletal: Negative for myalgias and arthralgias.  Skin: Negative.   Neurological: Positive for headaches. Negative for dizziness, weakness, light-headedness and numbness.  Psychiatric/Behavioral: Negative.   All other systems reviewed and are negative.    Allergies  Review of patient's allergies indicates no known allergies.  Home Medications   Current Outpatient Rx  Name  Route  Sig  Dispense  Refill  . AMLODIPINE BESYLATE 2.5 MG PO TABS      TAKE 1 TABLET BY MOUTH EVERY DAY   90 tablet   1   . ASPIRIN 81 MG PO CHEW   Oral   Chew 81 mg by mouth daily.         . ATENOLOL 100 MG PO TABS      TAKE 1 TABLET BY MOUTH EVERY DAY   90 tablet   1   . CALCIUM CARBONATE ANTACID 500 MG PO CHEW   Oral   Chew 1 tablet by mouth daily.           Marland Kitchen GLUCOSE BLOOD VI STRP      Use to check blood sugars one daily Dx code: 250.00 Non insulin dependent   100 each   1   . LEVOTHYROXINE SODIUM 75 MCG PO TABS      TAKE 1 TABLET BY MOUTH EVERY DAY   90 tablet   3   . LOSARTAN POTASSIUM 100 MG PO TABS  TAKE 1 TABLET BY MOUTH EVERY DAY   90 tablet   1   . METFORMIN HCL ER 500 MG PO TB24      TAKE 1 TABLET (500 MG TOTAL) BY MOUTH DAILY WITH BREAKFAST.   90 tablet   1     NEED 90 DAY SUPPLY   . ONETOUCH DELICA LANCETS MISC      Use check blood sugar once daily. Dx code 250.00, non-insulin dependent   100 each   1     BP 147/72  Pulse 53  Temp 98 F (36.7 C) (Oral)  Resp 14  SpO2 98%  Physical Exam  Nursing note and vitals reviewed. Constitutional: She is oriented to person, place, and time. She appears well-developed and well-nourished. No distress.  HENT:  Head: Normocephalic.  Right Ear: External ear normal.  Left Ear: External ear normal.  Nose: Nose normal.  Mouth/Throat: Oropharynx is clear and moist.  Eyes: Conjunctivae normal and EOM are normal. Pupils are equal, round, and reactive to light.  Neck: Normal range of motion. Neck  supple.  Cardiovascular: Normal rate, regular rhythm and normal heart sounds.   Pulmonary/Chest: Effort normal and breath sounds normal. No respiratory distress. She has no wheezes. She has no rales.  Abdominal: Soft. Bowel sounds are normal. She exhibits no distension. There is no tenderness. There is no rebound.  Musculoskeletal: She exhibits edema.       1+ bilateral pitting LE edema  Neurological: She is alert and oriented to person, place, and time.  Skin: Skin is warm and dry.  Psychiatric: She has a normal mood and affect.    ED Course  Procedures (including critical care time)    Date: 04/04/2012  Rate: 58  Rhythm: sinus bradycardia  QRS Axis: normal  Intervals: normal  ST/T Wave abnormalities: normal  Conduction Disutrbances:none  Narrative Interpretation:   Old EKG Reviewed: none available   Pt with htn at home, BP 202/82 on arrival. Only complaint is headache. Gradual onset, frontal. Improved with advil. Will get CT head, labs, will try 0.1 clonidine to lower BP.  Results for orders placed during the hospital encounter of 04/03/12  CBC WITH DIFFERENTIAL      Component Value Range   WBC 9.7  4.0 - 10.5 K/uL   RBC 4.77  3.87 - 5.11 MIL/uL   Hemoglobin 12.8  12.0 - 15.0 g/dL   HCT 16.1  09.6 - 04.5 %   MCV 82.8  78.0 - 100.0 fL   MCH 26.8  26.0 - 34.0 pg   MCHC 32.4  30.0 - 36.0 g/dL   RDW 40.9  81.1 - 91.4 %   Platelets 209  150 - 400 K/uL   Neutrophils Relative 68  43 - 77 %   Neutro Abs 6.6  1.7 - 7.7 K/uL   Lymphocytes Relative 25  12 - 46 %   Lymphs Abs 2.5  0.7 - 4.0 K/uL   Monocytes Relative 6  3 - 12 %   Monocytes Absolute 0.6  0.1 - 1.0 K/uL   Eosinophils Relative 1  0 - 5 %   Eosinophils Absolute 0.1  0.0 - 0.7 K/uL   Basophils Relative 0  0 - 1 %   Basophils Absolute 0.0  0.0 - 0.1 K/uL  COMPREHENSIVE METABOLIC PANEL      Component Value Range   Sodium 139  135 - 145 mEq/L   Potassium 3.4 (*) 3.5 - 5.1 mEq/L   Chloride 102  96 -  112 mEq/L    CO2 29  19 - 32 mEq/L   Glucose, Bld 172 (*) 70 - 99 mg/dL   BUN 22  6 - 23 mg/dL   Creatinine, Ser 9.60  0.50 - 1.10 mg/dL   Calcium 45.4 (*) 8.4 - 10.5 mg/dL   Total Protein 7.3  6.0 - 8.3 g/dL   Albumin 3.7  3.5 - 5.2 g/dL   AST 11  0 - 37 U/L   ALT 8  0 - 35 U/L   Alkaline Phosphatase 103  39 - 117 U/L   Total Bilirubin 0.2 (*) 0.3 - 1.2 mg/dL   GFR calc non Af Amer 63 (*) >90 mL/min   GFR calc Af Amer 73 (*) >90 mL/min  URINALYSIS, ROUTINE W REFLEX MICROSCOPIC      Component Value Range   Color, Urine YELLOW  YELLOW   APPearance CLEAR  CLEAR   Specific Gravity, Urine 1.010  1.005 - 1.030   pH 7.0  5.0 - 8.0   Glucose, UA NEGATIVE  NEGATIVE mg/dL   Hgb urine dipstick SMALL (*) NEGATIVE   Bilirubin Urine NEGATIVE  NEGATIVE   Ketones, ur NEGATIVE  NEGATIVE mg/dL   Protein, ur 30 (*) NEGATIVE mg/dL   Urobilinogen, UA 0.2  0.0 - 1.0 mg/dL   Nitrite NEGATIVE  NEGATIVE   Leukocytes, UA MODERATE (*) NEGATIVE  URINE MICROSCOPIC-ADD ON      Component Value Range   Squamous Epithelial / LPF MANY (*) RARE   WBC, UA 7-10  <3 WBC/hpf   RBC / HPF 3-6  <3 RBC/hpf   Bacteria, UA FEW (*) RARE  POCT I-STAT TROPONIN I      Component Value Range   Troponin i, poc 0.01  0.00 - 0.08 ng/mL   Comment 3            Ct Head Wo Contrast  04/03/2012  *RADIOLOGY REPORT*  Clinical Data: Headache.  Hypertension.  CT HEAD WITHOUT CONTRAST  Technique:  Contiguous axial images were obtained from the base of the skull through the vertex without contrast.  Comparison: None.  Findings: Enlarged subarachnoid spaces.  Normal size and position of the ventricles.  Incidentally noted cavum septum place and vergae.  No intracranial hemorrhage, mass lesion or CT evidence of acute infarction.  Unremarkable bones and included paranasal sinuses.  IMPRESSION:  1.  No acute abnormality. 2.  Mild cortical atrophy.   Original Report Authenticated By: Beckie Salts, M.D.     11:50 PM Pt's CT negative. Her headache has  resolved. Now complaining of some sore throat. Her pharynx was evaluated and appears normal with no signs of abscess or serious infection. Pt's BP also improved, now 147/66. Given pt's resolutions of symptoms, and gradual onset of headache, negative CT, doubt this is a subarachnoid bleed. PT insturcted to continue regular BP meds, follow up closely with PCP for rechec.    1. Hypertension   2. Headache   3. Sore throat       MDM  Pt with elevated BP at home, headache. Headache is gradual in onset, resolved with BP medications. Her labs unremarkable. Her CT head is negative. VS stable. Pt OK to d/c home with close bp monitoring and follow up. Instructed to return if worsening.         Lottie Mussel, PA 04/04/12 [phone]

## 2012-04-03 NOTE — ED Notes (Signed)
Per son pt. BP checked this AM at home and pt has been c/o headaches yesterday and today. Pt. Denies CP, sob, dizziness, vision changes and nausea.

## 2012-04-03 NOTE — ED Notes (Signed)
Tatayanna PA at bedside

## 2012-04-03 NOTE — ED Notes (Signed)
Pt. C/o sore throat.

## 2012-04-03 NOTE — ED Notes (Signed)
The pt has a headache and high bp since yesterday.  She has bp meds that she takes

## 2012-04-05 LAB — URINE CULTURE: Colony Count: >=100000

## 2012-04-07 ENCOUNTER — Telehealth: Payer: Self-pay | Admitting: Internal Medicine

## 2012-04-07 NOTE — Telephone Encounter (Signed)
Patient Information:  Caller Name: N/A  Phone: [phone]  Patient: Ashley Frederick, Ashley Frederick  Gender: Female  DOB: 05-07-1940  Age: 71 Years  PCP: Rene Paci (Adults only)  Office Follow Up:  Does the office need to follow up with this patient?: No  Instructions For The Office: N/A  RN Note:  Family calling to schedule follow up appt from ED visit.  States BP has been stable but on 04/03/12 it was 208/105.  Denies emergent symptoms currently; advised appt within 2 weeks.  Appt scheduled 04/08/12 1015 with Dr. Felicity Coyer.  krs/can  Symptoms  Reason For Call & Symptoms: blood pressure 208/105 04/03/12; seen in  ED was told to follow up with PCP.  currently BP is 148/89  Reviewed Health History In EMR: Yes  Reviewed Medications In EMR: Yes  Reviewed Allergies In EMR: Yes  Reviewed Surgeries / Procedures: Yes  Date of Onset of Symptoms: 04/03/2012  Guideline(s) Used:  High Blood Pressure  Disposition Per Guideline:   See Within 2 Weeks in Office  Reason For Disposition Reached:   BP > 160/100  Advice Given:  N/A  Appointment Scheduled:  04/08/2012 10:15:00 Appointment Scheduled Provider:  Rene Paci (Adults only)

## 2012-04-07 NOTE — ED Provider Notes (Signed)
Medical screening examination/treatment/procedure(s) were performed by non-physician practitioner and as supervising physician I was immediately available for consultation/collaboration.  Raeford Razor, MD 04/07/12 [phone]

## 2012-04-08 ENCOUNTER — Encounter: Payer: Self-pay | Admitting: Internal Medicine

## 2012-04-08 ENCOUNTER — Ambulatory Visit (INDEPENDENT_AMBULATORY_CARE_PROVIDER_SITE_OTHER): Payer: Medicare Other | Admitting: Internal Medicine

## 2012-04-08 VITALS — BP 142/88 | HR 50 | Temp 97.4°F | Ht 62.0 in | Wt 201.8 lb

## 2012-04-08 DIAGNOSIS — I10 Essential (primary) hypertension: Secondary | ICD-10-CM

## 2012-04-08 DIAGNOSIS — E119 Type 2 diabetes mellitus without complications: Secondary | ICD-10-CM

## 2012-04-08 DIAGNOSIS — L509 Urticaria, unspecified: Secondary | ICD-10-CM

## 2012-04-08 MED ORDER — TRIAMCINOLONE ACETONIDE 0.1 % EX LOTN: TOPICAL_LOTION | Freq: Three times a day (TID) | CUTANEOUS | Status: DC

## 2012-04-08 MED ORDER — PREDNISONE (PAK) 10 MG PO TABS: 10.0000 mg | ORAL_TABLET | ORAL | Status: DC

## 2012-04-08 MED ORDER — AMLODIPINE BESYLATE 5 MG PO TABS: 5.0000 mg | ORAL_TABLET | Freq: Every day | ORAL | Status: DC

## 2012-04-08 NOTE — Progress Notes (Signed)
  Subjective:    Patient ID: Ashley Frederick, female    DOB: 07/21/1940, 71 y.o.   MRN: [id number]  HPI  Here for follow up - hives BLE> trunk arms -onset 6 days ago Also uncontrolled blood pressure  Denies new med, new expsoure   Past Medical History  Diagnosis Date  . HYPOTHYROIDISM   . DYSLIPIDEMIA   . HYPERTENSION   . OSTEOPOROSIS   . OA (osteoarthritis) of knee   . Diabetes mellitus, type 2    Review of Systems  Constitutional: Negative for fever and fatigue.  Respiratory: Negative for cough and shortness of breath.   Cardiovascular: Negative for chest pain and palpitations.  Skin: Positive for rash (and itch x 6 days).  Neurological: Negative for dizziness, facial asymmetry, weakness and headaches.       Objective:   Physical Exam  BP 142/88  Pulse 50  Temp 97.4 F (36.3 C) (Oral)  Ht 5\' 2"  (1.575 m)  Wt 201 lb 12.8 oz (91.536 kg)  BMI 36.91 kg/m2  SpO2 95% Wt Readings from Last 3 Encounters:  04/08/12 201 lb 12.8 oz (91.536 kg)  02/26/12 207 lb 6.4 oz (94.076 kg)  07/04/11 202 lb 9.6 oz (91.899 kg)   Constitutional: She appears well-developed and well-nourished. No distress. translator at side Neck: Thick, Normal range of motion. Neck supple. No JVD present. No thyromegaly present.  Cardiovascular: Normal rate, regular rhythm and normal heart sounds.  No murmur heard. chronic trace BLE edema/fatty ankles Pulmonary/Chest: Effort normal and breath sounds normal. No respiratory distress. She has no wheezes. Skin - hives on BLE - also BUE but lesser extent. Few on lower abdomen, anterior chest and low back Mskel - B knees - boggy synovitis - tender to palpation over joint line; FROM and ligamentous function intact Psychiatric: She has a normal mood and affect. Her behavior is normal. Judgment and thought content normal.   Lab Results  Component Value Date   WBC 9.7 04/03/2012   HGB 12.8 04/03/2012   HCT 39.5 04/03/2012   PLT 209 04/03/2012   CHOL 224* 02/26/2012    TRIG 142.0 02/26/2012   HDL 38.10* 02/26/2012   LDLDIRECT 164.4 02/26/2012   ALT 8 04/03/2012   AST 11 04/03/2012   NA 139 04/03/2012   K 3.4* 04/03/2012   CL 102 04/03/2012   CREATININE 0.90 04/03/2012   BUN 22 04/03/2012   CO2 29 04/03/2012   TSH 2.81 02/26/2012   HGBA1C 7.2* 02/26/2012      Assessment & Plan:  See problem list. Medications and labs reviewed today.  Urticaria - ?reaction to HCTZ - stop same 6d pred taper and topical steroid as needed - pt will call if worse or unimproved

## 2012-04-08 NOTE — Patient Instructions (Addendum)
It was good to see you today. We have reviewed your ER records including labs and tests today Stop HCTZ (hydrochlorithiazide) Increase amlodipine to 5mg  daily for blood pressure -  Use prednisone taper x 6 days and steroid lotion for rash/itch as needed Your prescription(s) have been submitted to your pharmacy. Please take as directed and contact our office if you believe you are having problem(s) with the medication(s). Watch your sugars and call if over 200 in next few days - steroids will increase your sugars more than usual for next few days! Please schedule followup in 6 weeks for blood pressure recheck, call sooner if problems.  Hives Hives are itchy, red, swollen areas of the skin. They can vary in size and location on your body. Hives can come and go for hours or several days (acute hives) or for several weeks (chronic hives). Hives do not spread from person to person (noncontagious). They may get worse with scratching, exercise, and emotional stress. CAUSES    Allergic reaction to food, additives, or drugs.   Infections, including the common cold.   Illness, such as vasculitis, lupus, or thyroid disease.   Exposure to sunlight, heat, or cold.   Exercise.   Stress.   Contact with chemicals.  SYMPTOMS    Red or white swollen patches on the skin. The patches may change size, shape, and location quickly and repeatedly.   Itching.   Swelling of the hands, feet, and face. This may occur if hives develop deeper in the skin.  DIAGNOSIS   Your caregiver can usually tell what is wrong by performing a physical exam. Skin or blood tests may also be done to determine the cause of your hives. In some cases, the cause cannot be determined. TREATMENT   Mild cases usually get better with medicines such as antihistamines. Severe cases may require an emergency epinephrine injection. If the cause of your hives is known, treatment includes avoiding that trigger.   HOME CARE INSTRUCTIONS     Avoid causes that trigger your hives.   Take antihistamines as directed by your caregiver to reduce the severity of your hives. Non-sedating or low-sedating antihistamines are usually recommended. Do not drive while taking an antihistamine.   Take any other medicines prescribed for itching as directed by your caregiver.   Wear loose-fitting clothing.   Keep all follow-up appointments as directed by your caregiver.  SEEK MEDICAL CARE IF:    You have persistent or severe itching that is not relieved with medicine.   You have painful or swollen joints.  SEEK IMMEDIATE MEDICAL CARE IF:    You have a fever.   Your tongue or lips are swollen.   You have trouble breathing or swallowing.   You feel tightness in the throat or chest.   You have abdominal pain.  These problems may be the first sign of a life-threatening allergic reaction. Call your local emergency services (911 in [place] MAKE SURE YOU:    Understand these instructions.   Will watch your condition.   Will get help right away if you are not doing well or get worse.  Document Released: 04/14/2005 Document Revised: 10/14/2011 Document Reviewed: 07/08/2011 [facility] Patient Information 2013 [place], [place].

## 2012-04-08 NOTE — Assessment & Plan Note (Signed)
Same dx 11/2010 - started metformin The patient is asked to make an attempt to improve diet and exercise patterns to aid in medical management of this problem.  Lab Results  Component Value Date   HGBA1C 7.2* 02/26/2012   patient advised on anticipated increase in cbgs due to steroid effect for next several days - pt will call if cbg>200

## 2012-04-08 NOTE — Assessment & Plan Note (Signed)
  BP Readings from Last 3 Encounters:  04/08/12 142/88  04/04/12 136/73  02/26/12 130/84   Uncontrolled at ER visit for same 12/7 - Increase amlodipine now and stop HCTZ due to urticaria Recheck in 6 week, sooner if problems

## 2012-04-16 ENCOUNTER — Telehealth: Payer: Self-pay | Admitting: Internal Medicine

## 2012-04-16 NOTE — Telephone Encounter (Signed)
Patient Information:  Caller Name: [facility]  Phone: [phone]  Patient: Ashley Frederick, Ashley Frederick  Gender: Female  DOB: Jan 29, 1941  Age: 71 Years  PCP: Rene Paci (Adults only)  Office Follow Up:  Does the office need to follow up with this patient?: Yes  Instructions For The Office: appts not available for 04/16/12 for See Today disposition krs/can  RN Note:  Finished prednisone but rash continues.  Seen in ED 04/03/12 for hypertension, headache, and sore throat. Per protocol, advised appt today; no appts available in system.  Patient states can come to office in AM 04/17/12; appt scheduled 1000 04/17/12 1000 with Dr. Abner Greenspan.  Info to office for staff/provider review/appt management.  krs/can  Symptoms  Reason For Call & Symptoms: seen in office 04/08/12 and given Rx for skin allergy.  States finished the medication, but the rash has returned.  Rash is generalized and itchy.  Reviewed Health History In EMR: Yes  Reviewed Medications In EMR: Yes  Reviewed Allergies In EMR: Yes  Reviewed Surgeries / Procedures: Yes  Date of Onset of Symptoms: 04/09/2012  Guideline(s) Used:  Rash or Redness - Widespread  Disposition Per Guideline:   See Today in Office  Reason For Disposition Reached:   Sore throat  Advice Given:  N/A  Appointment Scheduled:  04/17/2012 10:00:00 Appointment Scheduled Provider:  N/A

## 2012-04-16 NOTE — Telephone Encounter (Signed)
Noted thanks °

## 2012-04-17 ENCOUNTER — Ambulatory Visit (INDEPENDENT_AMBULATORY_CARE_PROVIDER_SITE_OTHER): Payer: Medicare Other | Admitting: Family Medicine

## 2012-04-17 ENCOUNTER — Encounter: Payer: Self-pay | Admitting: Family Medicine

## 2012-04-17 VITALS — BP 132/82 | HR 55 | Temp 97.9°F | Ht 62.0 in | Wt 201.5 lb

## 2012-04-17 DIAGNOSIS — R21 Rash and other nonspecific skin eruption: Secondary | ICD-10-CM | POA: Insufficient documentation

## 2012-04-17 DIAGNOSIS — E119 Type 2 diabetes mellitus without complications: Secondary | ICD-10-CM

## 2012-04-17 DIAGNOSIS — I10 Essential (primary) hypertension: Secondary | ICD-10-CM

## 2012-04-17 MED ORDER — PREDNISONE (PAK) 10 MG PO TABS: 10.0000 mg | ORAL_TABLET | ORAL | Status: DC

## 2012-04-17 MED ORDER — CETIRIZINE HCL 10 MG PO TABS: 10.0000 mg | ORAL_TABLET | Freq: Every day | ORAL | Status: DC

## 2012-04-17 NOTE — Assessment & Plan Note (Signed)
Recurrent after Prednisone ended after reaction to HCTZ, given one more course of Prednisone which she tolerated well. Start Cetirizine, Sarna lotion and witch hazel astringent. Report if no improvement

## 2012-04-17 NOTE — Progress Notes (Signed)
Ashley Frederick [id number] 04/04/1941 04/17/2012      Progress Note-Follow Up  Subjective  Chief Complaint  Chief Complaint  Patient presents with  . Rash    on legs    HPI  Patient is a 71 year old female who is in today with an interpreter complaining of recurrent rash. She describes been given HCTZ in the ER and developing a diffuse rash after that. Stop HCTZ as directed instructed and start the prednisone. The rash resolved until the prednisone stopped in no distress. It's not as bad as it was. It is just on her lower legs at this time. It is itchy. Steroid cream is not helping. No other complaints. No headache, chest pain, palpitations, illness, GI or GU complaints otherwise noted. She says her blood sugars have been well-controlled despite the recent prednisone.  Past Medical History  Diagnosis Date  . HYPOTHYROIDISM   . DYSLIPIDEMIA   . HYPERTENSION   . OSTEOPOROSIS   . OA (osteoarthritis) of knee   . Diabetes mellitus, type 2     Past Surgical History  Procedure Date  . No past surgeries     History reviewed. No pertinent family history.  History   Social History  . Marital Status: Married    Spouse Name: N/A    Number of Children: N/A  . Years of Education: N/A   Occupational History  . Not on file.   Social History Main Topics  . Smoking status: Never Smoker   . Smokeless tobacco: Not on file     Comment: Moved to [place] [facility] to be closer to family- lives with son  . Alcohol Use: No  . Drug Use: No  . Sexually Active:    Other Topics Concern  . Not on file   Social History Narrative  . No narrative on file    Current Outpatient Prescriptions on File Prior to Visit  Medication Sig Dispense Refill  . amLODipine (NORVASC) 5 MG tablet Take 1 tablet (5 mg total) by mouth daily.  90 tablet  1  . aspirin 81 MG chewable tablet Chew 81 mg by mouth daily.      Marland Kitchen atenolol (TENORMIN) 100 MG tablet TAKE 1 TABLET BY MOUTH EVERY DAY  90 tablet  1  . calcium  carbonate (TUMS - DOSED IN MG ELEMENTAL CALCIUM) 500 MG chewable tablet Chew 1 tablet by mouth daily.        Marland Kitchen glucose blood (ONE TOUCH ULTRA TEST) test strip Use to check blood sugars one daily Dx code: 250.00 Non insulin dependent  100 each  1  . levothyroxine (SYNTHROID, LEVOTHROID) 75 MCG tablet TAKE 1 TABLET BY MOUTH EVERY DAY  90 tablet  3  . losartan (COZAAR) 100 MG tablet TAKE 1 TABLET BY MOUTH EVERY DAY  90 tablet  1  . metFORMIN (GLUCOPHAGE-XR) 500 MG 24 hr tablet TAKE 1 TABLET (500 MG TOTAL) BY MOUTH DAILY WITH BREAKFAST.  90 tablet  1  . ONETOUCH DELICA LANCETS MISC Use check blood sugar once daily. Dx code 250.00, non-insulin dependent  100 each  1  . triamcinolone lotion (KENALOG) 0.1 % Apply topically 3 (three) times daily.  60 mL  0  . cetirizine (ZYRTEC) 10 MG tablet Take 1 tablet (10 mg total) by mouth daily. Days and then as needed  30 tablet  2    Allergies  Allergen Reactions  . Hctz (Hydrochlorothiazide) Rash    Review of Systems  Review of Systems  Constitutional: Negative for fever  and malaise/fatigue.  HENT: Negative for congestion.   Eyes: Negative for discharge.  Respiratory: Negative for shortness of breath.   Cardiovascular: Negative for chest pain, palpitations and leg swelling.  Gastrointestinal: Negative for nausea, abdominal pain and diarrhea.  Genitourinary: Negative for dysuria.  Musculoskeletal: Negative for falls.  Skin: Positive for itching and rash.  Neurological: Negative for loss of consciousness and headaches.  Endo/Heme/Allergies: Negative for polydipsia.  Psychiatric/Behavioral: Negative for depression and suicidal ideas. The patient is not nervous/anxious and does not have insomnia.     Objective  BP 132/82  Pulse 55  Temp 97.9 F (36.6 C) (Oral)  Ht 5\' 2"  (1.575 m)  Wt 201 lb 8 oz (91.4 kg)  BMI 36.85 kg/m2  SpO2 95%  Physical Exam  Physical Exam  Constitutional: She is oriented to person, place, and time and  well-developed, well-nourished, and in no distress. No distress.  HENT:  Head: Normocephalic and atraumatic.  Eyes: Conjunctivae normal are normal.  Neck: Neck supple. No thyromegaly present.  Cardiovascular: Normal rate, regular rhythm and normal heart sounds.   No murmur heard. Pulmonary/Chest: Effort normal and breath sounds normal. She has no wheezes.  Abdominal: She exhibits no distension and no mass.  Musculoskeletal: She exhibits no edema.  Lymphadenopathy:    She has no cervical adenopathy.  Neurological: She is alert and oriented to person, place, and time.  Skin: Skin is warm and dry. Rash noted. She is not diaphoretic. There is erythema.       Erythematous maculopapular lesions scattered over both lower extremities from foot half way up tibial plateau  Psychiatric: Memory, affect and judgment normal.    Lab Results  Component Value Date   TSH 2.81 02/26/2012   Lab Results  Component Value Date   WBC 9.7 04/03/2012   HGB 12.8 04/03/2012   HCT 39.5 04/03/2012   MCV 82.8 04/03/2012   PLT 209 04/03/2012   Lab Results  Component Value Date   CREATININE 0.90 04/03/2012   BUN 22 04/03/2012   NA 139 04/03/2012   K 3.4* 04/03/2012   CL 102 04/03/2012   CO2 29 04/03/2012   Lab Results  Component Value Date   ALT 8 04/03/2012   AST 11 04/03/2012   ALKPHOS 103 04/03/2012   BILITOT 0.2* 04/03/2012   Lab Results  Component Value Date   CHOL 224* 02/26/2012   Lab Results  Component Value Date   HDL 38.10* 02/26/2012   Lab Results  Component Value Date   LDLCALC 73 06/06/2009   Lab Results  Component Value Date   TRIG 142.0 02/26/2012   Lab Results  Component Value Date   CHOLHDL 6 02/26/2012     Assessment & Plan  Rash Recurrent after Prednisone ended after reaction to HCTZ, given one more course of Prednisone which she tolerated well. Start Cetirizine, Sarna lotion and witch hazel astringent. Report if no improvement  Diabetes mellitus, type 2 BS this am 132, no  spikes with recent treatment  HYPERTENSION Well controlled today despite pruritis

## 2012-04-17 NOTE — Assessment & Plan Note (Signed)
Well controlled today despite pruritis

## 2012-04-17 NOTE — Assessment & Plan Note (Signed)
BS this am 132, no spikes with recent treatment

## 2012-04-17 NOTE — Patient Instructions (Addendum)
Can try Sarna anti itch lotion over the counter and [employer] as needed for itching Contact Dermatitis Contact dermatitis is a rash that happens when something touches the skin. You touched something that irritates your skin, or you have allergies to something you touched. HOME CARE   Avoid the thing that caused your rash.  Keep your rash away from hot water, soap, sunlight, chemicals, and other things that might bother it.  Do not scratch your rash.  You can take cool baths to help stop itching.  Only take medicine as told by your doctor.  Keep all doctor visits as told. GET HELP RIGHT AWAY IF:   Your rash is not better after 3 days.  Your rash gets worse.  Your rash is puffy (swollen), tender, red, sore, or warm.  You have problems with your medicine. MAKE SURE YOU:   Understand these instructions.  Will watch your condition.  Will get help right away if you are not doing well or get worse. Document Released: 02/09/2009 Document Revised: 07/07/2011 Document Reviewed: 09/17/2010 [facility] Patient Information 2013 [place], [place].

## 2012-04-19 ENCOUNTER — Telehealth: Payer: Self-pay | Admitting: *Deleted

## 2012-04-19 NOTE — Telephone Encounter (Signed)
Called son Drue Stager) gave md response. Transferred to schedulers to make 2 week f/u...Raechel Chute

## 2012-04-19 NOTE — Telephone Encounter (Signed)
Received fax pt is allergic to HCTZ requesting med to be change to something else. Pt came in sat clinic for med reaction...Raechel Chute

## 2012-04-19 NOTE — Telephone Encounter (Signed)
Agree with stopping HCTZ at this time  We just Increased dose amlodipine to 5mg  on 04/08/12 - no other med changes recommended at this time to "substitute for HCTZ" - needs OV to recheck blood pressure in 2 weeks to see if alternate med needed  thanks

## 2012-05-03 ENCOUNTER — Ambulatory Visit: Payer: Medicare Other | Admitting: Internal Medicine

## 2012-05-04 ENCOUNTER — Encounter: Payer: Self-pay | Admitting: Internal Medicine

## 2012-05-04 ENCOUNTER — Other Ambulatory Visit: Payer: Self-pay | Admitting: *Deleted

## 2012-05-04 ENCOUNTER — Ambulatory Visit (INDEPENDENT_AMBULATORY_CARE_PROVIDER_SITE_OTHER): Payer: Medicare Other | Admitting: Internal Medicine

## 2012-05-04 VITALS — BP 130/88 | HR 62 | Temp 98.2°F | Wt 205.1 lb

## 2012-05-04 DIAGNOSIS — J028 Acute pharyngitis due to other specified organisms: Secondary | ICD-10-CM

## 2012-05-04 DIAGNOSIS — J029 Acute pharyngitis, unspecified: Secondary | ICD-10-CM

## 2012-05-04 DIAGNOSIS — B9789 Other viral agents as the cause of diseases classified elsewhere: Secondary | ICD-10-CM

## 2012-05-04 DIAGNOSIS — E119 Type 2 diabetes mellitus without complications: Secondary | ICD-10-CM

## 2012-05-04 DIAGNOSIS — I10 Essential (primary) hypertension: Secondary | ICD-10-CM

## 2012-05-04 DIAGNOSIS — R21 Rash and other nonspecific skin eruption: Secondary | ICD-10-CM

## 2012-05-04 MED ORDER — GLUCOSE BLOOD VI STRP: ORAL_STRIP | Status: DC

## 2012-05-04 MED ORDER — FREESTYLE LANCETS MISC: Status: DC

## 2012-05-04 NOTE — Progress Notes (Signed)
  Subjective:    Patient ID: Ashley Frederick, female    DOB: November 16, 1940, 72 y.o.   MRN: [id number]  HPI  Here for follow up - hives and blood pressure    Past Medical History  Diagnosis Date  . HYPOTHYROIDISM   . DYSLIPIDEMIA   . HYPERTENSION   . OSTEOPOROSIS   . OA (osteoarthritis) of knee   . Diabetes mellitus, type 2    Review of Systems  Constitutional: Negative for fever and fatigue.  Respiratory: Negative for cough and shortness of breath.   Cardiovascular: Negative for chest pain and palpitations.  Skin: Negative for rash.  Neurological: Negative for dizziness, facial asymmetry, weakness and headaches.       Objective:   Physical Exam  BP 130/88  Pulse 62  Temp 98.2 F (36.8 C) (Oral)  Wt 205 lb 1.9 oz (93.042 kg)  SpO2 95% Wt Readings from Last 3 Encounters:  05/04/12 205 lb 1.9 oz (93.042 kg)  04/17/12 201 lb 8 oz (91.4 kg)  04/08/12 201 lb 12.8 oz (91.536 kg)   Constitutional: She appears well-developed and well-nourished. No distress. translator at side HENT: benign - no exudate Neck: Thick, Normal range of motion. Neck supple. No JVD present. No thyromegaly present.  Cardiovascular: Normal rate, regular rhythm and normal heart sounds.  No murmur heard. chronic trace BLE edema/fatty ankles Pulmonary/Chest: Effort normal and breath sounds normal. No respiratory distress. She has no wheezes. Skin - resolved hives on BLE and BUE but lesser extent. Few on lower abdomen, anterior chest and low back Psychiatric: She has a normal mood and affect. Her behavior is normal. Judgment and thought content normal.   Lab Results  Component Value Date   WBC 9.7 04/03/2012   HGB 12.8 04/03/2012   HCT 39.5 04/03/2012   PLT 209 04/03/2012   CHOL 224* 02/26/2012   TRIG 142.0 02/26/2012   HDL 38.10* 02/26/2012   LDLDIRECT 164.4 02/26/2012   ALT 8 04/03/2012   AST 11 04/03/2012   NA 139 04/03/2012   K 3.4* 04/03/2012   CL 102 04/03/2012   CREATININE 0.90 04/03/2012   BUN 22  04/03/2012   CO2 29 04/03/2012   TSH 2.81 02/26/2012   HGBA1C 7.2* 02/26/2012      Assessment & Plan:  See problem list. Medications and labs reviewed today.  Urticaria - resolved - d/t reaction to HCTZ -   sore throat - viral - symptomatic care advised

## 2012-05-04 NOTE — Patient Instructions (Addendum)
It was good to see you today. We have reviewed your prior records including labs and tests today Medications reviewed, no changes at this time. If you develop worsening symptoms or fever, call and we can reconsider antibiotics, but it does not appear necessary to use antibiotics at this time. New diabetes mellitus meter and supplies today Please schedule followup in 2-3 for diabetes mellitus check, call sooner if problems. -ok to cancel 04/2012 appointment

## 2012-05-04 NOTE — Assessment & Plan Note (Signed)
Same dx 11/2010 - started metformin The patient is asked to make an attempt to improve diet and exercise patterns to aid in medical management of this problem. New meter and supplies provided today  Lab Results  Component Value Date   HGBA1C 7.2* 02/26/2012

## 2012-05-04 NOTE — Assessment & Plan Note (Signed)
  BP Readings from Last 3 Encounters:  05/04/12 130/88  04/17/12 132/82  04/08/12 142/88   Uncontrolled at ER visit for same 12/7 - Increased amlodipine 03/2012 and stopped HCTZ due to urticaria The current medical regimen is effective;  continue present plan and medications.

## 2012-05-04 NOTE — Telephone Encounter (Signed)
Need to send strips for Free style to pharmacy. Was given monitor at ov...Raechel Chute

## 2012-05-20 ENCOUNTER — Ambulatory Visit: Payer: Medicare Other | Admitting: Internal Medicine

## 2012-06-04 ENCOUNTER — Emergency Department (HOSPITAL_COMMUNITY)
Admission: EM | Admit: 2012-06-04 | Discharge: 2012-06-04 | Disposition: A | Discharge status: Home / Self Care | Payer: Medicare Other | Attending: Emergency Medicine | Admitting: Emergency Medicine

## 2012-06-04 ENCOUNTER — Encounter (HOSPITAL_COMMUNITY): Payer: Self-pay | Admitting: Emergency Medicine

## 2012-06-04 ENCOUNTER — Emergency Department (HOSPITAL_COMMUNITY): Payer: Medicare Other

## 2012-06-04 DIAGNOSIS — W19XXXA Unspecified fall, initial encounter: Secondary | ICD-10-CM

## 2012-06-04 DIAGNOSIS — Y939 Activity, unspecified: Secondary | ICD-10-CM | POA: Insufficient documentation

## 2012-06-04 DIAGNOSIS — E119 Type 2 diabetes mellitus without complications: Secondary | ICD-10-CM | POA: Insufficient documentation

## 2012-06-04 DIAGNOSIS — Z7982 Long term (current) use of aspirin: Secondary | ICD-10-CM | POA: Insufficient documentation

## 2012-06-04 DIAGNOSIS — S1093XA Contusion of unspecified part of neck, initial encounter: Secondary | ICD-10-CM | POA: Insufficient documentation

## 2012-06-04 DIAGNOSIS — Z8739 Personal history of other diseases of the musculoskeletal system and connective tissue: Secondary | ICD-10-CM | POA: Insufficient documentation

## 2012-06-04 DIAGNOSIS — I10 Essential (primary) hypertension: Secondary | ICD-10-CM | POA: Insufficient documentation

## 2012-06-04 DIAGNOSIS — W010XXA Fall on same level from slipping, tripping and stumbling without subsequent striking against object, initial encounter: Secondary | ICD-10-CM | POA: Insufficient documentation

## 2012-06-04 DIAGNOSIS — Y9289 Other specified places as the place of occurrence of the external cause: Secondary | ICD-10-CM | POA: Insufficient documentation

## 2012-06-04 DIAGNOSIS — Z79899 Other long term (current) drug therapy: Secondary | ICD-10-CM | POA: Insufficient documentation

## 2012-06-04 DIAGNOSIS — M549 Dorsalgia, unspecified: Secondary | ICD-10-CM

## 2012-06-04 DIAGNOSIS — E785 Hyperlipidemia, unspecified: Secondary | ICD-10-CM | POA: Insufficient documentation

## 2012-06-04 DIAGNOSIS — S0003XA Contusion of scalp, initial encounter: Secondary | ICD-10-CM | POA: Insufficient documentation

## 2012-06-04 DIAGNOSIS — E039 Hypothyroidism, unspecified: Secondary | ICD-10-CM | POA: Insufficient documentation

## 2012-06-04 DIAGNOSIS — IMO0002 Reserved for concepts with insufficient information to code with codable children: Secondary | ICD-10-CM | POA: Insufficient documentation

## 2012-06-04 DIAGNOSIS — S0093XA Contusion of unspecified part of head, initial encounter: Secondary | ICD-10-CM

## 2012-06-04 NOTE — ED Provider Notes (Signed)
  Medical screening examination/treatment/procedure(s) were performed by non-physician practitioner and as supervising physician I was immediately available for consultation/collaboration.  On my exam the patient was in no distress. She is neurologically intact, sitting upright, interacting with me and her family members appropriately. I saw the ECG (if appropriate), relevant labs and studies - I agree with the interpretation.    Gerhard Munch, MD 06/04/12 [phone]

## 2012-06-04 NOTE — ED Provider Notes (Signed)
History   This chart was scribed for non-physician practitioner working with Gerhard Munch, MD by Frederik Pear, ED Scribe. This patient was seen in room TR10C/TR10C and the patient's care was started at 1920.   CSN: [id number]  Arrival date & time 06/04/12  4098   None     Chief Complaint  Patient presents with  . Fall  . Back Pain    (Consider location/radiation/quality/duration/timing/severity/associated sxs/prior treatment) Patient is a 72 y.o. female presenting with back pain. The history is provided by the patient and a relative. The history is limited by a language barrier. A language interpreter was used.  Back Pain  The current episode started yesterday. The problem occurs constantly. The problem has been gradually worsening. The pain is associated with falling. The pain is present in the lumbar spine.    Ashley Frederick is a 72 y.o. female who presents to the Emergency Department complaining of gradually worsening, constant, sudden onset lower back pain with associated headache that began yesterday after she slipped and fell in a puddle of water and landed on her tailbone first followed by her head. She denies being ambulatory for 15 minutes after the fall. She denies any LOC. She also complains of dizziness with standing that began today.    Past Medical History  Diagnosis Date  . HYPOTHYROIDISM   . DYSLIPIDEMIA   . HYPERTENSION   . OSTEOPOROSIS   . OA (osteoarthritis) of knee   . Diabetes mellitus, type 2     Past Surgical History  Procedure Date  . No past surgeries     History reviewed. No pertinent family history.  History  Substance Use Topics  . Smoking status: Never Smoker   . Smokeless tobacco: Not on file     Comment: Moved to [place] [facility] to be closer to family- lives with son  . Alcohol Use: No    OB History    Grav Para Term Preterm Abortions TAB SAB Ect Mult Living                  Review of Systems  Musculoskeletal: Positive for back  pain.  Neurological: Negative for syncope.  All other systems reviewed and are negative.    Allergies  Hctz  Home Medications   Current Outpatient Rx  Name  Route  Sig  Dispense  Refill  . AMLODIPINE BESYLATE 5 MG PO TABS   Oral   Take 1 tablet (5 mg total) by mouth daily.   90 tablet   1   . ASPIRIN 81 MG PO CHEW   Oral   Chew 81 mg by mouth daily.         . ATENOLOL 100 MG PO TABS   Oral   Take 100 mg by mouth daily.         . IBUPROFEN 200 MG PO TABS   Oral   Take 400 mg by mouth daily as needed. For pain         . LEVOTHYROXINE SODIUM 75 MCG PO TABS   Oral   Take 75 mcg by mouth daily.         Marland Kitchen LOSARTAN POTASSIUM 100 MG PO TABS   Oral   Take 100 mg by mouth daily.         Marland Kitchen METFORMIN HCL ER 500 MG PO TB24   Oral   Take 500 mg by mouth daily with breakfast.         . GLUCOSE BLOOD VI STRP  Use to check blood sugar twice a day. Dx 250.00   100 each   2   . FREESTYLE LANCETS MISC      [place]   100 each   2     BP 162/95  Pulse 64  Temp 98.5 F (36.9 C) (Oral)  Resp 17  SpO2 95%  Physical Exam  Nursing note and vitals reviewed. Constitutional: She is oriented to person, place, and time. She appears well-developed and well-nourished. No distress.  HENT:  Head: Normocephalic and atraumatic. Head is without abrasion and without laceration.  Eyes: EOM are normal. Pupils are equal, round, and reactive to light.  Neck: Normal range of motion. Neck supple. No tracheal deviation present.  Cardiovascular: Normal rate.   Pulmonary/Chest: Effort normal. No respiratory distress.  Abdominal: Soft. She exhibits no distension.  Musculoskeletal: Normal range of motion. She exhibits no edema.       Lumbar back: She exhibits tenderness. She exhibits no swelling.  Neurological: She is alert and oriented to person, place, and time.  Skin: Skin is warm and dry.  Psychiatric: She has a normal mood and affect. Her behavior is normal.    ED Course   Procedures (including critical care time)  DIAGNOSTIC STUDIES: Oxygen Saturation is 95% on room air, adequate by my interpretation.    COORDINATION OF CARE:  19:35- Discussed planned course of treatment with the patient, including a head CT without contrast and lumbar spine X-ray, who is agreeable at this time.   Labs Reviewed - No data to display Dg Lumbar Spine Complete  06/04/2012  *RADIOLOGY REPORT*  Clinical Data: Fall, low back pain  LUMBAR SPINE - COMPLETE 4+ VIEW  Comparison: 08/06/2010  Findings: Six lumbar-type vertebral bodies.  For consistency, these will be numbered T12-L5.  Normal lumbar lordosis.  No evidence of fracture or dislocation. Vertebral body heights are maintained.  Mild multilevel degenerative changes.  Mild superior endplate deformities at T10-12, grossly unchanged.  Grade 1 anterolisthesis of L4 on L5, unchanged.  IMPRESSION: No fracture or dislocation is seen.  Mild superior endplate deformities at T10-12, unchanged.  Grade 1 anterolisthesis of L4 on L5, unchanged.   Original Report Authenticated By: Charline Bills, M.D.    Ct Head Wo Contrast  06/04/2012  *RADIOLOGY REPORT*  Clinical Data: Slipped, fall, dizziness  CT HEAD WITHOUT CONTRAST  Technique:  Contiguous axial images were obtained from the base of the skull through the vertex without contrast.  Comparison: Head CT 04/03/2012  Findings: No intracranial hemorrhage.  No parenchymal contusion. No midline shift or mass effect.  Basilar cisterns are patent. No skull base fracture.  No fluid in the paranasal sinuses or mastoid air cells.  IMPRESSION: No intracranial trauma.   Original Report Authenticated By: Genevive Bi, M.D.      No diagnosis found.  Radiology results reviewed and shared with family and patient.  Patient discussed with and seen by Dr. Jeraldine Loots.  Will discharge home with minor head injury instructions and return precautions.  Patient to follow-up with her PCP  MDM    I personally  performed the services described in this documentation, which was scribed in my presence. The recorded information has been reviewed and is accurate.         Jimmye Norman, NP 06/04/12 2146

## 2012-06-04 NOTE — ED Notes (Signed)
Pt sts slipped and fell yesterday and having pain in lower back and HA today; pt denies LOC

## 2012-06-04 NOTE — ED Notes (Signed)
Patient transported to X-ray 

## 2012-06-09 ENCOUNTER — Ambulatory Visit: Payer: Medicare Other | Admitting: Internal Medicine

## 2012-06-14 ENCOUNTER — Encounter: Payer: Self-pay | Admitting: Internal Medicine

## 2012-06-14 ENCOUNTER — Ambulatory Visit (INDEPENDENT_AMBULATORY_CARE_PROVIDER_SITE_OTHER): Payer: Medicare Other | Admitting: Internal Medicine

## 2012-06-14 VITALS — BP 130/92 | HR 60 | Temp 97.9°F | Ht 62.0 in | Wt 203.0 lb

## 2012-06-14 DIAGNOSIS — M810 Age-related osteoporosis without current pathological fracture: Secondary | ICD-10-CM

## 2012-06-14 DIAGNOSIS — M5416 Radiculopathy, lumbar region: Secondary | ICD-10-CM

## 2012-06-14 DIAGNOSIS — IMO0002 Reserved for concepts with insufficient information to code with codable children: Secondary | ICD-10-CM

## 2012-06-14 DIAGNOSIS — M549 Dorsalgia, unspecified: Secondary | ICD-10-CM

## 2012-06-14 MED ORDER — TRAMADOL HCL 50 MG PO TABS
50.0000 mg | ORAL_TABLET | Freq: Four times a day (QID) | ORAL | Status: DC | PRN
Start: 2012-06-14 — End: 2012-07-05

## 2012-06-14 NOTE — Patient Instructions (Signed)
It was good to see you today. We have reviewed your prior records including labs and tests today Test(s) ordered today. Your results will be released to MyChart (or called to you) after review, usually within 72hours after test completion. If any changes need to be made, you will be notified at that same time. Medications reviewed, use tramadol (Ultram) as needed for pain symptoms -no changes at this time. Your prescription(s) have been submitted to your pharmacy. Please take as directed and contact our office if you believe you are having problem(s) with the medication(s).

## 2012-06-14 NOTE — Progress Notes (Signed)
Subjective:    Patient ID: Ashley Frederick, female    DOB: [birth year], 72 y.o.   MRN: [id number]  HPI  complains of back pain - Onset 1 week ago Precipitated by accidental fall seen in ER for same later same day: neg plain film L -spine and head ct Continued pain with any movement, not controlled with OTC meds - tylenol or ibuprofen  Pain 7/10, worse with movement Pain located in mid spine below bra line Aches in B low back and down RLE Denies prior hx pain like this Son concerned due to hx osteoporosis  Past Medical History  Diagnosis Date  . HYPOTHYROIDISM   . DYSLIPIDEMIA   . HYPERTENSION   . OSTEOPOROSIS   . OA (osteoarthritis) of knee   . Diabetes mellitus, type 2     Review of Systems  Constitutional: Negative for fever and fatigue.  Respiratory: Negative for cough and shortness of breath.   Genitourinary: Negative for dysuria, hematuria and flank pain.  Musculoskeletal: Positive for back pain and gait problem. Negative for joint swelling.  Neurological: Negative for weakness and numbness.  Psychiatric/Behavioral: Negative for behavioral problems and confusion.       Objective:   Physical Exam BP 130/92  Pulse 60  Temp(Src) 97.9 F (36.6 C) (Oral)  Ht 5\' 2"  (1.575 m)  Wt 203 lb (92.08 kg)  BMI 37.12 kg/m2  SpO2 94% Gen: uncomfortable at rest, but NAD - son at side Musician) MSkel: Back: full range of motion of thoracic and lumbar spine. tender to direct palpation over lower T/upper L, aortic stenosis muscle spasm B paraspinal region. Negative straight leg raise. DTR's are symmetrically intact. Sensation intact in all dermatomes of the lower extremities. Full strength to manual muscle testing. patient is able to heel toe walk without difficulty and ambulates with antalgic gait.  Lab Results  Component Value Date   WBC 9.7 04/03/2012   HGB 12.8 04/03/2012   HCT 39.5 04/03/2012   PLT 209 04/03/2012   GLUCOSE 172* 04/03/2012   CHOL 224* 02/26/2012   TRIG 142.0  02/26/2012   HDL 38.10* 02/26/2012   LDLDIRECT 164.4 02/26/2012   LDLCALC 73 06/06/2009   ALT 8 04/03/2012   AST 11 04/03/2012   NA 139 04/03/2012   K 3.4* 04/03/2012   CL 102 04/03/2012   CREATININE 0.90 04/03/2012   BUN 22 04/03/2012   CO2 29 04/03/2012   TSH 2.81 02/26/2012   HGBA1C 7.2* 02/26/2012   Dg Lumbar Spine Complete  06/04/2012  *RADIOLOGY REPORT*  Clinical Data: Fall, low back pain  LUMBAR SPINE - COMPLETE 4+ VIEW  Comparison: 08/06/2010  Findings: Six lumbar-type vertebral bodies.  For consistency, these will be numbered T12-L5.  Normal lumbar lordosis.  No evidence of fracture or dislocation. Vertebral body heights are maintained.  Mild multilevel degenerative changes.  Mild superior endplate deformities at T10-12, grossly unchanged.  Grade 1 anterolisthesis of L4 on L5, unchanged.  IMPRESSION: No fracture or dislocation is seen.  Mild superior endplate deformities at T10-12, unchanged.  Grade 1 anterolisthesis of L4 on L5, unchanged.   Original Report Authenticated By: Charline Bills, M.D.    Ct Head Wo Contrast  06/04/2012  *RADIOLOGY REPORT*  Clinical Data: Slipped, fall, dizziness  CT HEAD WITHOUT CONTRAST  Technique:  Contiguous axial images were obtained from the base of the skull through the vertex without contrast.  Comparison: Head CT 04/03/2012  Findings: No intracranial hemorrhage.  No parenchymal contusion. No midline shift or mass effect.  Basilar  cisterns are patent. No skull base fracture.  No fluid in the paranasal sinuses or mastoid air cells.  IMPRESSION: No intracranial trauma.   Original Report Authenticated By: Genevive Bi, M.D.       Assessment & Plan:   LBP, acute since fall 7 days ago - ?compression fx RLE radiculopathy - ?HNP related to trauma - neuro exam intact today Osteoporosis, senile  ER records and imaging reviewed order MRI L spine  Tramadol prn pain to avoid adverse effects of narcotics - erx done Further tx to depend on results and response  to tx

## 2012-06-16 ENCOUNTER — Ambulatory Visit
Admission: RE | Admit: 2012-06-16 | Discharge: 2012-06-16 | Disposition: A | Discharge status: Home / Self Care | Payer: Medicare Other | Source: Ambulatory Visit | Attending: Internal Medicine | Admitting: Internal Medicine

## 2012-06-16 DIAGNOSIS — M810 Age-related osteoporosis without current pathological fracture: Secondary | ICD-10-CM

## 2012-06-16 DIAGNOSIS — M549 Dorsalgia, unspecified: Secondary | ICD-10-CM

## 2012-06-16 DIAGNOSIS — M5416 Radiculopathy, lumbar region: Secondary | ICD-10-CM

## 2012-07-02 ENCOUNTER — Ambulatory Visit: Payer: Medicare Other | Admitting: Internal Medicine

## 2012-07-05 ENCOUNTER — Encounter: Payer: Self-pay | Admitting: Internal Medicine

## 2012-07-05 ENCOUNTER — Ambulatory Visit (INDEPENDENT_AMBULATORY_CARE_PROVIDER_SITE_OTHER): Payer: Medicare Other | Admitting: Internal Medicine

## 2012-07-05 VITALS — BP 120/62 | HR 58 | Temp 96.9°F | Wt 204.0 lb

## 2012-07-05 DIAGNOSIS — M5417 Radiculopathy, lumbosacral region: Secondary | ICD-10-CM

## 2012-07-05 DIAGNOSIS — R51 Headache: Secondary | ICD-10-CM

## 2012-07-05 DIAGNOSIS — M546 Pain in thoracic spine: Secondary | ICD-10-CM

## 2012-07-05 DIAGNOSIS — IMO0002 Reserved for concepts with insufficient information to code with codable children: Secondary | ICD-10-CM

## 2012-07-05 DIAGNOSIS — M545 Low back pain, unspecified: Secondary | ICD-10-CM

## 2012-07-05 MED ORDER — HYDROCODONE-ACETAMINOPHEN 5-325 MG PO TABS: 1.0000 | ORAL_TABLET | Freq: Four times a day (QID) | ORAL | Status: DC | PRN

## 2012-07-05 NOTE — Progress Notes (Signed)
Subjective:    Patient ID: Ashley Frederick, female    DOB: 11-Jan-1941, 72 y.o.   MRN: [id number]  HPI   complains of continued back pain - Onset 1 month ago Precipitated by accidental fall 06/13/12 seen in ER for same later same day: neg plain film L -spine and head ct Continued pain with any movement, not controlled with OTC meds - tylenol or ibuprofen -  Also ineffective relief with tramadol Pain 7/10, worse with movement Pain located in mid spine below bra line Aches in B low back and down RLE, also mild but daily headache Denies prior hx pain like this  Past Medical History  Diagnosis Date  . HYPOTHYROIDISM   . DYSLIPIDEMIA   . HYPERTENSION   . OSTEOPOROSIS   . OA (osteoarthritis) of knee   . Diabetes mellitus, type 2     Review of Systems  Constitutional: Negative for fever and fatigue.  Respiratory: Negative for cough and shortness of breath.   Genitourinary: Negative for dysuria, hematuria and flank pain.  Musculoskeletal: Positive for back pain and gait problem. Negative for joint swelling.  Neurological: Negative for weakness and numbness.  Psychiatric/Behavioral: Negative for behavioral problems and confusion.       Objective:   Physical Exam  BP 120/62  Pulse 58  Temp(Src) 96.9 F (36.1 C) (Oral)  Wt 204 lb (92.534 kg)  BMI 37.3 kg/m2  SpO2 92% Gen: uncomfortable at rest, but NAD - son and translator at side MSkel: Back: full range of motion of thoracic and lumbar spine. tender to direct palpation over lower T/upper L, associated with muscle spasm B paraspinal region. Negative straight leg raise. DTR's are symmetrically intact. Sensation intact in all dermatomes of the lower extremities. Full strength to manual muscle testing. patient is able to heel toe walk without difficulty and ambulates with antalgic gait.  Lab Results  Component Value Date   WBC 9.7 04/03/2012   HGB 12.8 04/03/2012   HCT 39.5 04/03/2012   PLT 209 04/03/2012   GLUCOSE 172* 04/03/2012    CHOL 224* 02/26/2012   TRIG 142.0 02/26/2012   HDL 38.10* 02/26/2012   LDLDIRECT 164.4 02/26/2012   LDLCALC 73 06/06/2009   ALT 8 04/03/2012   AST 11 04/03/2012   NA 139 04/03/2012   K 3.4* 04/03/2012   CL 102 04/03/2012   CREATININE 0.90 04/03/2012   BUN 22 04/03/2012   CO2 29 04/03/2012   TSH 2.81 02/26/2012   HGBA1C 7.2* 02/26/2012   Dg Lumbar Spine Complete  06/04/2012  *RADIOLOGY REPORT*  Clinical Data: Fall, low back pain  LUMBAR SPINE - COMPLETE 4+ VIEW  Comparison: 08/06/2010  Findings: Six lumbar-type vertebral bodies.  For consistency, these will be numbered T12-L5.  Normal lumbar lordosis.  No evidence of fracture or dislocation. Vertebral body heights are maintained.  Mild multilevel degenerative changes.  Mild superior endplate deformities at T10-12, grossly unchanged.  Grade 1 anterolisthesis of L4 on L5, unchanged.  IMPRESSION: No fracture or dislocation is seen.  Mild superior endplate deformities at T10-12, unchanged.  Grade 1 anterolisthesis of L4 on L5, unchanged.   Original Report Authenticated By: Charline Bills, M.D.     Mr Lumbar Spine Wo Contrast  06/17/2012  *RADIOLOGY REPORT*  Clinical Data: Right lower extremity radiculopathy.  Mid to lower back pain for 9 days after fall.  MRI LUMBAR SPINE WITHOUT CONTRAST  Technique:  Multiplanar and multiecho pulse sequences of the lumbar spine were obtained without intravenous contrast.  Comparison: Radiographs 06/04/2012.  IMPRESSION: 1.  L5-S1 right foraminal disc extrusion with cranial migration in the foramen compressing the exiting right L5 nerve, potentially associated with a right lower extremity radicular symptoms.  2. No bone marrow edema or compression fractures are identified. The lower thoracic vertebrae are within normal limits.  Appearance on prior radiographs was artifactual. 3.  Severe L4-L5 facet degeneration with mild facet arthritis. Moderate central stenosis at L3-L4 and L4-L5 without neural compression.   Original  Report Authenticated By: Andreas Newport, M.D.       Assessment & Plan:   LBP, acute since fall 1 month ago - no compression fx RLE radiculopathy - note L5-S1 foraminal disc extrusion on MRI -?related to trauma -  neuro exam remains intact today Osteoporosis, senile  ER records and imaging reviewed including MRI L spine  hydrocodone prn pain - new rx done Refer to spine specialist and consider HHPT after eval by specialitst

## 2012-07-05 NOTE — Patient Instructions (Signed)
It was good to see you today. We have reviewed your prior records including labs and tests today Medications reviewed, use hydrocodone (Norco) as needed for pain symptoms -no changes at this time. Your prescription(s) have been submitted to your pharmacy. Please take as directed and contact our office if you believe you are having problem(s) with the medication(s). we'll make referral to spine specialist. Our office will contact you regarding appointment(s) once made.

## 2012-07-20 ENCOUNTER — Encounter: Payer: Self-pay | Admitting: Internal Medicine

## 2012-07-20 ENCOUNTER — Ambulatory Visit (INDEPENDENT_AMBULATORY_CARE_PROVIDER_SITE_OTHER): Payer: Medicare Other | Admitting: Internal Medicine

## 2012-07-20 ENCOUNTER — Other Ambulatory Visit (INDEPENDENT_AMBULATORY_CARE_PROVIDER_SITE_OTHER): Payer: Medicare Other

## 2012-07-20 VITALS — BP 122/72 | HR 64 | Temp 97.7°F | Ht 62.0 in | Wt 208.0 lb

## 2012-07-20 DIAGNOSIS — R609 Edema, unspecified: Secondary | ICD-10-CM

## 2012-07-20 DIAGNOSIS — T887XXA Unspecified adverse effect of drug or medicament, initial encounter: Secondary | ICD-10-CM

## 2012-07-20 DIAGNOSIS — T50905A Adverse effect of unspecified drugs, medicaments and biological substances, initial encounter: Secondary | ICD-10-CM

## 2012-07-20 LAB — BASIC METABOLIC PANEL
Calcium: 10.1 mg/dL (ref 8.4–10.5)
Chloride: 104 mEq/L (ref 96–112)
Creatinine, Ser: 0.7 mg/dL (ref 0.4–1.2)
GFR: 92.18 mL/min (ref >60.00)

## 2012-07-20 MED ORDER — FUROSEMIDE 20 MG PO TABS: 20.0000 mg | ORAL_TABLET | Freq: Every day | ORAL | Status: DC

## 2012-07-20 MED ORDER — METHYLPREDNISOLONE ACETATE 80 MG/ML IJ SUSP
80.0000 mg | Freq: Once | INTRAMUSCULAR | Status: AC
  Administered 2012-07-20: 80 mg via INTRAMUSCULAR

## 2012-07-20 NOTE — Patient Instructions (Signed)
Peripheral Edema  You have swelling in your legs (peripheral edema). This swelling is due to excess accumulation of salt and water in your body. Edema may be a sign of heart, kidney or liver disease, or a side effect of a medication. It may also be due to problems in the leg veins. Elevating your legs and using special support stockings may be very helpful, if the cause of the swelling is due to poor venous circulation. Avoid long periods of standing, whatever the cause.  Treatment of edema depends on identifying the cause. Chips, pretzels, pickles and other salty foods should be avoided. Restricting salt in your diet is almost always needed. Water pills (diuretics) are often used to remove the excess salt and water from your body via urine. These medicines prevent the kidney from reabsorbing sodium. This increases urine flow.  Diuretic treatment may also result in lowering of potassium levels in your body. Potassium supplements may be needed if you have to use diuretics daily. Daily weights can help you keep track of your progress in clearing your edema. You should call your caregiver for follow up care as recommended.  SEEK IMMEDIATE MEDICAL CARE IF:    You have increased swelling, pain, redness, or heat in your legs.   You develop shortness of breath, especially when lying down.   You develop chest or abdominal pain, weakness, or fainting.   You have a fever.  Document Released: 05/22/2004 Document Revised: 07/07/2011 Document Reviewed: 05/02/2009  [employer] Patient Information 2013 [employer]

## 2012-07-20 NOTE — Progress Notes (Signed)
Subjective:    Patient ID: Ashley Frederick, female    DOB: November 16, 1940, 72 y.o.   MRN: [id number]  HPI  Pt presents to the clinic today with c/o rash on her bilateral lower extremities. This started 1 week ago. She has not had any contact irritants, no lotions soaps or detergents. She did start a new medication about 1 week ago. One of her doctors had put her on Gabapentin for her back pain. Shortly after she started taking it, she noticed the rash and the swelling. She has not taking any bendaryl OTC.   Review of Systems      Past Medical History  Diagnosis Date  . HYPOTHYROIDISM   . DYSLIPIDEMIA   . HYPERTENSION   . OSTEOPOROSIS   . OA (osteoarthritis) of knee   . Diabetes mellitus, type 2     Current Outpatient Prescriptions  Medication Sig Dispense Refill  . amLODipine (NORVASC) 5 MG tablet Take 1 tablet (5 mg total) by mouth daily.  90 tablet  1  . aspirin 81 MG chewable tablet Chew 81 mg by mouth daily.      Marland Kitchen atenolol (TENORMIN) 100 MG tablet Take 100 mg by mouth daily.      Marland Kitchen glucose blood (FREESTYLE INSULINX TEST) test strip Use to check blood sugar twice a day. Dx 250.00  100 each  2  . HYDROcodone-acetaminophen (NORCO) 5-325 MG per tablet Take 1 tablet by mouth every 6 (six) hours as needed for pain.  30 tablet  0  . ibuprofen (ADVIL,MOTRIN) 200 MG tablet Take 400 mg by mouth daily as needed. For pain      . Lancets (FREESTYLE) lancets [place]  100 each  2  . levothyroxine (SYNTHROID, LEVOTHROID) 75 MCG tablet Take 75 mcg by mouth daily.      Marland Kitchen losartan (COZAAR) 100 MG tablet Take 100 mg by mouth daily.      . metFORMIN (GLUCOPHAGE-XR) 500 MG 24 hr tablet Take 500 mg by mouth daily with breakfast.      . furosemide (LASIX) 20 MG tablet Take 1 tablet (20 mg total) by mouth daily.  5 tablet  0   No current facility-administered medications for this visit.    Allergies  Allergen Reactions  . Hctz (Hydrochlorothiazide) Rash    History reviewed. No pertinent family  history.  History   Social History  . Marital Status: Married    Spouse Name: N/A    Number of Children: N/A  . Years of Education: N/A   Occupational History  . Not on file.   Social History Main Topics  . Smoking status: Never Smoker   . Smokeless tobacco: Not on file     Comment: [place]oved [place] [facility] to be closer to family- lives with son  . Alcohol Use: No  . Drug Use: No  . Sexually Active: Not on file   Other Topics Concern  . Not on file   Social History Narrative  . No narrative on file     Constitutional: Denies fever, malaise, fatigue, headache or abrupt weight changes.  HEENT: Denies eye pain, eye redness, ear pain, ringing in the ears, wax buildup, runny nose, nasal congestion, bloody nose, or sore throat. Respiratory: Denies difficulty breathing, shortness of breath, cough or sputum production.   Cardiovascular: Pt reports swelling in BLE. Denies chest pain, chest tightness, palpitations or swelling in the hands.  Skin: Pt with rash on BLE. Denies redness, lesions or ulcercations.    No other specific  complaints in a complete review of systems (except as listed in HPI above).  Objective:   Physical Exam  BP 122/72  Pulse 64  Temp(Src) 97.7 F (36.5 C) (Oral)  Ht 5\' 2"  (1.575 m)  Wt 208 lb (94.348 kg)  BMI 38.03 kg/m2  SpO2 97% Wt Readings from Last 3 Encounters:  07/20/12 208 lb (94.348 kg)  07/05/12 204 lb (92.534 kg)  06/14/12 203 lb (92.08 kg)    General: Appears her stated age, well developed, well nourished in NAD. Skin: allergic rash on BLE. Warm, dry and intact. No lesions or ulcerations noted.  Cardiovascular: Normal rate and rhythm. S1,S2 noted.  No murmur, rubs or gallops noted. No JVD. 3+ pitting edema of BLE. No carotid bruits noted. Pulmonary/Chest: Normal effort and positive vesicular breath sounds. No respiratory distress. No wheezes, rales or ronchi noted.         Assessment & Plan:   Allergic reaction due to medication,  new onset:  80 mg Depo IM today eRx for lasix 20 mg x 5 days Elevate legs to reduce swelling rtc as need or if symptoms persist

## 2012-08-15 ENCOUNTER — Other Ambulatory Visit: Payer: Self-pay | Admitting: Internal Medicine

## 2012-08-30 ENCOUNTER — Telehealth: Payer: Self-pay | Admitting: *Deleted

## 2012-08-30 DIAGNOSIS — M549 Dorsalgia, unspecified: Secondary | ICD-10-CM

## 2012-08-30 NOTE — Telephone Encounter (Signed)
done

## 2012-08-30 NOTE — Telephone Encounter (Signed)
Caller is requesting a PT referral for patient's back.

## 2012-08-31 NOTE — Telephone Encounter (Signed)
Notified pt son Drue Stager) with md response...lmb

## 2012-09-12 ENCOUNTER — Other Ambulatory Visit: Payer: Self-pay | Admitting: Family Medicine

## 2012-09-13 ENCOUNTER — Other Ambulatory Visit: Payer: Self-pay | Admitting: Internal Medicine

## 2012-09-13 NOTE — Telephone Encounter (Signed)
Please advise refill? 

## 2012-10-20 ENCOUNTER — Other Ambulatory Visit: Payer: Self-pay | Admitting: Internal Medicine

## 2012-10-22 ENCOUNTER — Other Ambulatory Visit: Payer: Self-pay | Admitting: Internal Medicine

## 2012-11-22 ENCOUNTER — Other Ambulatory Visit: Payer: Self-pay | Admitting: Internal Medicine

## 2012-12-22 ENCOUNTER — Other Ambulatory Visit: Payer: Self-pay | Admitting: *Deleted

## 2012-12-22 MED ORDER — METFORMIN HCL ER 500 MG PO TB24: ORAL_TABLET | ORAL | Status: DC

## 2013-01-19 ENCOUNTER — Other Ambulatory Visit: Payer: Self-pay | Admitting: *Deleted

## 2013-01-19 MED ORDER — GLUCOSE BLOOD VI STRP: ORAL_STRIP | Status: DC

## 2013-01-19 NOTE — Telephone Encounter (Signed)
Faxed [facility] back to [facility].../lmb 

## 2013-02-14 ENCOUNTER — Other Ambulatory Visit: Payer: Self-pay | Admitting: Internal Medicine

## 2013-02-15 ENCOUNTER — Other Ambulatory Visit: Payer: Self-pay | Admitting: Internal Medicine

## 2013-02-25 ENCOUNTER — Other Ambulatory Visit: Payer: Self-pay | Admitting: Internal Medicine

## 2013-03-22 ENCOUNTER — Telehealth: Payer: Self-pay | Admitting: Internal Medicine

## 2013-03-22 DIAGNOSIS — M549 Dorsalgia, unspecified: Secondary | ICD-10-CM

## 2013-03-22 NOTE — Telephone Encounter (Signed)
Pt request referral for Dr. Charlett Blake from [facility] due to back pain. Please advise.

## 2013-03-22 NOTE — Telephone Encounter (Signed)
Ok done

## 2013-08-12 ENCOUNTER — Other Ambulatory Visit: Payer: Self-pay | Admitting: *Deleted

## 2013-08-12 MED ORDER — GLUCOSE BLOOD VI STRP: ORAL_STRIP | Status: DC

## 2013-08-23 ENCOUNTER — Other Ambulatory Visit: Payer: Self-pay | Admitting: Neurosurgery

## 2013-08-23 DIAGNOSIS — M549 Dorsalgia, unspecified: Secondary | ICD-10-CM

## 2013-08-31 ENCOUNTER — Ambulatory Visit
Admission: RE | Admit: 2013-08-31 | Discharge: 2013-08-31 | Disposition: A | Discharge status: Home / Self Care | Payer: Medicare Other | Source: Ambulatory Visit | Attending: Neurosurgery | Admitting: Neurosurgery

## 2013-08-31 DIAGNOSIS — M549 Dorsalgia, unspecified: Secondary | ICD-10-CM

## 2013-08-31 MED ORDER — IOHEXOL 180 MG/ML  SOLN
15.0000 mL | Freq: Once | INTRAMUSCULAR | Status: AC | PRN
  Administered 2013-08-31: 15 mL via INTRATHECAL

## 2013-08-31 NOTE — Progress Notes (Signed)
Son states pt has been off tramadol for the past 2 days. Discharge instructions explained to pt by interpreto.r

## 2013-08-31 NOTE — Discharge Instructions (Signed)
Myelogram Discharge Instructions  1. Go home and rest quietly for the next 24 hours.  It is important to lie flat for the next 24 hours.  Get up only to go to the restroom.  You may lie in the bed or on a couch on your back, your stomach, your left side or your right side.  You may have one pillow under your head.  You may have pillows between your knees while you are on your side or under your knees while you are on your back.  2. DO NOT drive today.  Recline the seat as far back as it will go, while still wearing your seat belt, on the way home.  3. You may get up to go to the bathroom as needed.  You may sit up for 10 minutes to eat.  You may resume your normal diet and medications unless otherwise indicated.  Drink lots of extra fluids today and tomorrow.  4. The incidence of headache, nausea, or vomiting is about 5% (one in 20 patients).  If you develop a headache, lie flat and drink plenty of fluids until the headache goes away.  Caffeinated beverages may be helpful.  If you develop severe nausea and vomiting or a headache that does not go away with flat bed rest, call [phone].  5. You may resume normal activities after your 24 hours of bed rest is over; however, do not exert yourself strongly or do any heavy lifting tomorrow. If when you get up you have a headache when standing, go back to bed and force fluids for another 24 hours.  6. Call your physician for a follow-up appointment.  The results of your myelogram will be sent directly to your physician by the following day.  7. If you have any questions or if complications develop after you arrive home, please call [phone].  Discharge instructions have been explained to the patient.  The patient, or the person responsible for the patient, fully understands these instructions.      May resume Tramadol on Sep 01, 2013, after 1:00 pm.

## 2014-02-14 ENCOUNTER — Other Ambulatory Visit: Payer: Self-pay

## 2014-02-14 MED ORDER — ATENOLOL 100 MG PO TABS: ORAL_TABLET | ORAL | Status: DC

## 2014-05-16 ENCOUNTER — Other Ambulatory Visit: Payer: Self-pay

## 2014-05-16 MED ORDER — GLUCOSE BLOOD VI STRP: ORAL_STRIP | Status: DC

## 2015-01-19 ENCOUNTER — Other Ambulatory Visit: Payer: Self-pay | Admitting: Internal Medicine

## 2015-01-19 DIAGNOSIS — E213 Hyperparathyroidism, unspecified: Secondary | ICD-10-CM

## 2015-02-02 ENCOUNTER — Encounter: Admission: RE | Admit: 2015-02-02 | Payer: Medicare Other | Source: Ambulatory Visit

## 2015-03-23 ENCOUNTER — Emergency Department (HOSPITAL_COMMUNITY)
Admission: EM | Admit: 2015-03-23 | Discharge: 2015-03-23 | Discharge status: Absent without leave | Payer: Medicare Other

## 2015-04-05 ENCOUNTER — Ambulatory Visit: Payer: Medicare Other

## 2015-04-05 ENCOUNTER — Ambulatory Visit (INDEPENDENT_AMBULATORY_CARE_PROVIDER_SITE_OTHER): Payer: Medicare Other

## 2015-04-05 ENCOUNTER — Ambulatory Visit (INDEPENDENT_AMBULATORY_CARE_PROVIDER_SITE_OTHER): Payer: Medicare Other | Admitting: Family Medicine

## 2015-04-05 VITALS — BP 118/80 | HR 60 | Temp 98.4°F | Resp 16 | Ht 61.0 in | Wt 176.0 lb

## 2015-04-05 DIAGNOSIS — R630 Anorexia: Secondary | ICD-10-CM

## 2015-04-05 DIAGNOSIS — R1084 Generalized abdominal pain: Secondary | ICD-10-CM

## 2015-04-05 DIAGNOSIS — K219 Gastro-esophageal reflux disease without esophagitis: Secondary | ICD-10-CM

## 2015-04-05 DIAGNOSIS — E039 Hypothyroidism, unspecified: Secondary | ICD-10-CM

## 2015-04-05 DIAGNOSIS — N390 Urinary tract infection, site not specified: Secondary | ICD-10-CM | POA: Diagnosis not present

## 2015-04-05 DIAGNOSIS — E892 Postprocedural hypoparathyroidism: Secondary | ICD-10-CM

## 2015-04-05 DIAGNOSIS — Z9089 Acquired absence of other organs: Secondary | ICD-10-CM

## 2015-04-05 DIAGNOSIS — E1169 Type 2 diabetes mellitus with other specified complication: Secondary | ICD-10-CM

## 2015-04-05 DIAGNOSIS — Z9009 Acquired absence of other part of head and neck: Secondary | ICD-10-CM

## 2015-04-05 LAB — POCT CBC
Granulocyte percent: 71.1 %G (ref 37–80)
HCT, POC: 35.5 % — AB (ref 37.7–47.9)
Hemoglobin: 11.6 g/dL — AB (ref 12.2–16.2)
Lymph, poc: 2 (ref 0.6–3.4)
MCH, POC: 25.9 pg — AB (ref 27–31.2)
MCHC: 32.6 g/dL (ref 31.8–35.4)
MCV: 79.74 fL — AB (ref 80–97)
MID (cbc): 0.5 (ref 0–0.9)
MPV: 8.4 fL (ref 0–99.8)
POC Granulocyte: 6.3 (ref 2–6.9)
POC LYMPH PERCENT: 22.8 %L (ref 10–50)
POC MID %: 6.1 %M (ref 0–12)
Platelet Count, POC: 458 10*3/uL — AB (ref 142–424)
RBC: 4.47 M/uL (ref 4.04–5.48)
RDW, POC: 14.5 %
WBC: 8.9 10*3/uL (ref 4.6–10.2)

## 2015-04-05 LAB — POCT URINALYSIS DIP (MANUAL ENTRY)
Blood, UA: NEGATIVE
Glucose, UA: NEGATIVE
Nitrite, UA: NEGATIVE
Protein Ur, POC: 100 — AB
Spec Grav, UA: 1.015
Urobilinogen, UA: 0.2
pH, UA: 5.5

## 2015-04-05 LAB — POC MICROSCOPIC URINALYSIS (UMFC): Mucus: ABSENT

## 2015-04-05 LAB — POCT GLYCOSYLATED HEMOGLOBIN (HGB A1C): Hemoglobin A1C: 7

## 2015-04-05 LAB — HEMOGLOBIN A1C: Hgb A1c MFr Bld: 7 % — AB (ref 4.0–6.0)

## 2015-04-05 LAB — TSH: TSH: 1.494 u[IU]/mL (ref 0.350–4.500)

## 2015-04-05 LAB — GLUCOSE, POCT (MANUAL RESULT ENTRY): POC Glucose: 148 mg/dl — AB (ref 70–99)

## 2015-04-05 MED ORDER — CEPHALEXIN 500 MG PO CAPS: 500.0000 mg | ORAL_CAPSULE | Freq: Two times a day (BID) | ORAL | Status: DC

## 2015-04-05 NOTE — Progress Notes (Signed)
Chief Complaint:  Chief Complaint  Patient presents with  . unable to eat    x 15 days   . Abdominal Pain    pt. feels like she will vomit if she eats, feels bloated after eating anything   . Rash    above lip     HPI: Ashley Frederick is a 74 y.o. female who reports to [facility] today complaining of throat sugery for thyroidectomy 3 weeks ago.  She cannot eat and no appetite, her stomach is bloated and she vomits and has nausea without food. Just looking at food makes her sick.  She is abe to drink water without problems She does not know who the surgeon is.  Minimal records show that it is Dr Carmin Muskrat She had a parathyroidectomy.  No fevers or chills.  No pain with urination, no back pain, no rashes Her DM is well controlled  Past Medical History  Diagnosis Date  . HYPOTHYROIDISM   . DYSLIPIDEMIA   . HYPERTENSION   . OSTEOPOROSIS   . OA (osteoarthritis) of knee   . Diabetes mellitus, type 2 [facility])    Past Surgical History  Procedure Laterality Date  . No past surgeries    . Thyroid surgery     Social History   Social History  . Marital Status: Married    Spouse Name: N/A  . Number of Children: N/A  . Years of Education: N/A   Social History Main Topics  . Smoking status: Never Smoker   . Smokeless tobacco: Never Used     Comment: Moved to [place] to be closer to family- lives with son  . Alcohol Use: No  . Drug Use: No  . Sexual Activity: Not Asked   Other Topics Concern  . None   Social History Narrative   History reviewed. No pertinent family history. Allergies  Allergen Reactions  . Hctz [Hydrochlorothiazide] Rash   Prior to Admission medications   Medication Sig Start Date End Date Taking? Authorizing Provider  amLODipine (NORVASC) 5 MG tablet TAKE 1 TABLET (5 MG TOTAL) BY MOUTH DAILY. 10/20/12  Yes Rowe Clack, MD  aspirin 81 MG chewable tablet Chew 81 mg by mouth daily.   Yes Historical Provider, MD  atenolol (TENORMIN) 100 MG  tablet Take 100 mg by mouth daily.   Yes Historical Provider, MD  atenolol (TENORMIN) 100 MG tablet TAKE 1 TABLET BY MOUTH EVERY DAY 02/14/14  Yes Rowe Clack, MD  glucose blood (FREESTYLE INSULINX TEST) test strip Use to check blood sugar twice a day. Dx E11.9 05/16/14  Yes Rowe Clack, MD  guaiFENesin-codeine [facility]) 100-10 MG/5ML syrup Take 5 mLs by mouth 4 (four) times daily as needed for cough.   Yes Historical Provider, MD  ibuprofen (ADVIL,MOTRIN) 200 MG tablet Take 400 mg by mouth daily as needed. For pain   Yes Historical Provider, MD  Lancets (FREESTYLE) lancets [place] 05/04/12  Yes Rowe Clack, MD  levothyroxine (SYNTHROID, LEVOTHROID) 75 MCG tablet Take 75 mcg by mouth daily.   Yes Historical Provider, MD  metFORMIN (GLUCOPHAGE-XR) 500 MG 24 hr tablet Take 500 mg by mouth daily with breakfast.   Yes Historical Provider, MD  cetirizine (ZYRTEC) 10 MG tablet TAKE 1 TABLET (10 MG TOTAL) BY MOUTH DAILY AND THEN AS NEEDED Patient not taking: Reported on 04/05/2015 09/13/12   Rowe Clack, MD  furosemide (LASIX) 20 MG tablet Take 1 tablet (20 mg total) by mouth  daily. Patient not taking: Reported on 04/05/2015 07/20/12   Jearld Fenton, NP  HYDROcodone-acetaminophen (NORCO) 5-325 MG per tablet Take 1 tablet by mouth every 6 (six) hours as needed for pain. Patient not taking: Reported on 04/05/2015 07/05/12   Rowe Clack, MD     ROS: The patient denies fevers, chills, night sweats, unintentional weight loss, chest pain, palpitations, wheezing, dyspnea on exertion, dysuria, hematuria, melena, numbness, weakness, or tingling.   All other systems have been reviewed and were otherwise negative with the exception of those mentioned in the HPI and as above.    PHYSICAL EXAM: Filed Vitals:   04/05/15 1028  BP: 118/80  Pulse: 60  Temp: 98.4 F (36.9 C)  Resp: 16   Body mass index is 33.27 kg/(m^2).   General: Alert, no acute distress HEENT:  Normocephalic,  atraumatic, oropharynx patent. EOMI, PERRLA Tm normal, no exudates Cardiovascular:  Regular rate and rhythm, no rubs murmurs or gallops.  No Carotid bruits, radial pulse intact. No pedal edema.  Respiratory: Clear to auscultation bilaterally.  No wheezes, rales, or rhonchi.  No cyanosis, no use of accessory musculature Abdominal: No organomegaly, abdomen is soft and non-tender, positive bowel sounds. No masses. Skin: No rashes. Neurologic: Facial musculature symmetric. Psychiatric: Patient acts appropriately throughout our interaction. Lymphatic: No cervical or submandibular lymphadenopathy Musculoskeletal: Gait intact. No edema, tenderness   LABS: Results for orders placed or performed in visit on 04/05/15  POCT CBC  Result Value Ref Range   WBC 8.9 4.6 - 10.2 K/uL   Lymph, poc 2.0 0.6 - 3.4   POC LYMPH PERCENT 22.8 10 - 50 %L   MID (cbc) 0.5 0 - 0.9   POC MID % 6.1 0 - 12 %M   POC Granulocyte 6.3 2 - 6.9   Granulocyte percent 71.1 37 - 80 %G   RBC 4.47 4.04 - 5.48 M/uL   Hemoglobin 11.6 (A) 12.2 - 16.2 g/dL   HCT, POC 35.5 (A) 37.7 - 47.9 %   MCV 79.74 (A) 80 - 97 fL   MCH, POC 25.9 (A) 27 - 31.2 pg   MCHC 32.6 31.8 - 35.4 g/dL   RDW, POC 14.5 %   Platelet Count, POC 458 (A) 142 - 424 K/uL   MPV 8.4 0 - 99.8 fL  POCT urinalysis dipstick  Result Value Ref Range   Color, UA yellow yellow   Clarity, UA clear clear   Glucose, UA negative negative   Bilirubin, UA small (A) negative   Ketones, POC UA trace (5) (A) negative   Spec Grav, UA 1.015    Blood, UA negative negative   pH, UA 5.5    Protein Ur, POC =100 (A) negative   Urobilinogen, UA 0.2    Nitrite, UA Negative Negative   Leukocytes, UA small (1+) (A) Negative  POCT Microscopic Urinalysis ([facility])  Result Value Ref Range   WBC,UR,HPF,POC Moderate (A) None WBC/hpf   RBC,UR,HPF,POC None None RBC/hpf   Bacteria Moderate (A) None, Too numerous to count   Mucus Absent Absent   Epithelial Cells, UR Per Microscopy  Moderate (A) None, Too numerous to count cells/hpf  POCT glucose (manual entry)  Result Value Ref Range   POC Glucose 148 (A) 70 - 99 mg/dl  POCT glycosylated hemoglobin (Hb A1C)  Result Value Ref Range   Hemoglobin A1C 7.0      EKG/XRAY:   Primary read interpreted by Dr. Marin Comment at [facility]. Neg for acute cardiopulm process ? Hiatal hernia  ASSESSMENT/PLAN: Encounter Diagnoses  Name Primary?  . Loss of appetite   . Hypothyroidism, unspecified hypothyroidism type   . H/O parathyroidectomy   . Type 2 diabetes mellitus with other specified complication ([place])   . Generalized abdominal pain   . UTI (urinary tract infection), uncomplicated Yes  . Gastroesophageal reflux disease, esophagitis presence not specified    74 y/o female with decrease appetite per family with some n/v with food intake. She is sitting and eating peanut butter crackers as I am interviewing her and the family.  There is a langauge barrie but the son and husband canc translate. Interestingly she had a parathyroidectomy at an outside facility [facility] and I cannot get careeveryhwere activated for her.  Rx Keflex for possible infection urine cx pending. Rx  PPI for possible GERD sxs  Push fluids Miralax Labs pending Fu prn    Gross sideeffects, risk and benefits, and alternatives of medications d/w patient. Patient is aware that all medications have potential sideeffects and we are unable to predict every sideeffect or drug-drug interaction that may occur.  Bali Lyn DO  04/05/2015 12:47 PM

## 2015-04-05 NOTE — Patient Instructions (Addendum)

## 2015-04-06 LAB — COMPLETE METABOLIC PANEL WITH GFR
ALT: 10 U/L (ref 6–29)
Albumin: 3.5 g/dL — ABNORMAL LOW (ref 3.6–5.1)
Alkaline Phosphatase: 64 U/L (ref 33–130)
Calcium: 9 mg/dL (ref 8.6–10.4)
Creat: 0.8 mg/dL (ref 0.60–0.93)
GFR, Est African American: 85 mL/min (ref >=60)
GFR, Est Non African American: 73 mL/min (ref >=60)
Potassium: 5 mmol/L (ref 3.5–5.3)
Total Bilirubin: 0.5 mg/dL (ref 0.2–1.2)
Total Protein: 7.1 g/dL (ref 6.1–8.1)

## 2015-04-06 LAB — URINE CULTURE: Colony Count: 70000

## 2015-04-06 LAB — LIPASE: Lipase: 42 U/L (ref 7–60)

## 2015-04-06 LAB — COMPLETE METABOLIC PANEL WITHOUT GFR
AST: 15 U/L (ref 10–35)
BUN: 14 mg/dL (ref 7–25)
CO2: 25 mmol/L (ref 20–31)
Chloride: 105 mmol/L (ref 98–110)
Glucose, Bld: 128 mg/dL — ABNORMAL HIGH (ref 65–99)
Sodium: 139 mmol/L (ref 135–146)

## 2015-04-12 ENCOUNTER — Encounter: Payer: Self-pay | Admitting: Family Medicine

## 2015-06-04 DIAGNOSIS — I10 Essential (primary) hypertension: Secondary | ICD-10-CM | POA: Diagnosis not present

## 2015-06-04 DIAGNOSIS — E039 Hypothyroidism, unspecified: Secondary | ICD-10-CM | POA: Diagnosis not present

## 2015-06-04 DIAGNOSIS — E119 Type 2 diabetes mellitus without complications: Secondary | ICD-10-CM | POA: Diagnosis not present

## 2015-06-04 DIAGNOSIS — E785 Hyperlipidemia, unspecified: Secondary | ICD-10-CM | POA: Diagnosis not present

## 2015-07-12 ENCOUNTER — Encounter: Payer: Self-pay | Admitting: Family Medicine

## 2015-08-09 DIAGNOSIS — E039 Hypothyroidism, unspecified: Secondary | ICD-10-CM | POA: Diagnosis not present

## 2015-08-09 DIAGNOSIS — E119 Type 2 diabetes mellitus without complications: Secondary | ICD-10-CM | POA: Diagnosis not present

## 2015-08-09 DIAGNOSIS — Z6833 Body mass index (BMI) 33.0-33.9, adult: Secondary | ICD-10-CM | POA: Diagnosis not present

## 2015-08-09 DIAGNOSIS — E784 Other hyperlipidemia: Secondary | ICD-10-CM | POA: Diagnosis not present

## 2015-08-09 DIAGNOSIS — E669 Obesity, unspecified: Secondary | ICD-10-CM | POA: Diagnosis not present

## 2015-08-09 DIAGNOSIS — I10 Essential (primary) hypertension: Secondary | ICD-10-CM | POA: Diagnosis not present

## 2015-08-09 DIAGNOSIS — Z13220 Encounter for screening for lipoid disorders: Secondary | ICD-10-CM | POA: Diagnosis not present

## 2016-02-05 DIAGNOSIS — H40023 Open angle with borderline findings, high risk, bilateral: Secondary | ICD-10-CM | POA: Diagnosis not present

## 2016-02-05 DIAGNOSIS — H40033 Anatomical narrow angle, bilateral: Secondary | ICD-10-CM | POA: Diagnosis not present

## 2016-02-05 DIAGNOSIS — H11423 Conjunctival edema, bilateral: Secondary | ICD-10-CM | POA: Diagnosis not present

## 2016-02-05 DIAGNOSIS — H11153 Pinguecula, bilateral: Secondary | ICD-10-CM | POA: Diagnosis not present

## 2016-02-05 DIAGNOSIS — H25013 Cortical age-related cataract, bilateral: Secondary | ICD-10-CM | POA: Diagnosis not present

## 2016-02-05 DIAGNOSIS — H18413 Arcus senilis, bilateral: Secondary | ICD-10-CM | POA: Diagnosis not present

## 2016-02-05 DIAGNOSIS — H2513 Age-related nuclear cataract, bilateral: Secondary | ICD-10-CM | POA: Diagnosis not present

## 2016-02-06 DIAGNOSIS — Z23 Encounter for immunization: Secondary | ICD-10-CM | POA: Diagnosis not present

## 2016-02-06 DIAGNOSIS — E119 Type 2 diabetes mellitus without complications: Secondary | ICD-10-CM | POA: Diagnosis not present

## 2016-02-06 DIAGNOSIS — M25519 Pain in unspecified shoulder: Secondary | ICD-10-CM | POA: Diagnosis not present

## 2016-02-06 DIAGNOSIS — I10 Essential (primary) hypertension: Secondary | ICD-10-CM | POA: Diagnosis not present

## 2016-02-06 DIAGNOSIS — E039 Hypothyroidism, unspecified: Secondary | ICD-10-CM | POA: Diagnosis not present

## 2016-02-25 DIAGNOSIS — M25511 Pain in right shoulder: Secondary | ICD-10-CM | POA: Diagnosis not present

## 2016-02-26 DIAGNOSIS — H16223 Keratoconjunctivitis sicca, not specified as Sjogren's, bilateral: Secondary | ICD-10-CM | POA: Diagnosis not present

## 2016-02-26 DIAGNOSIS — H40033 Anatomical narrow angle, bilateral: Secondary | ICD-10-CM | POA: Diagnosis not present

## 2016-02-26 DIAGNOSIS — H2513 Age-related nuclear cataract, bilateral: Secondary | ICD-10-CM | POA: Diagnosis not present

## 2016-02-26 DIAGNOSIS — H40013 Open angle with borderline findings, low risk, bilateral: Secondary | ICD-10-CM | POA: Diagnosis not present

## 2016-03-17 DIAGNOSIS — E039 Hypothyroidism, unspecified: Secondary | ICD-10-CM | POA: Diagnosis not present

## 2016-03-17 DIAGNOSIS — M810 Age-related osteoporosis without current pathological fracture: Secondary | ICD-10-CM | POA: Diagnosis not present

## 2016-03-18 DIAGNOSIS — M810 Age-related osteoporosis without current pathological fracture: Secondary | ICD-10-CM | POA: Diagnosis not present

## 2016-03-24 ENCOUNTER — Ambulatory Visit (INDEPENDENT_AMBULATORY_CARE_PROVIDER_SITE_OTHER): Payer: Medicare Other | Admitting: Physician Assistant

## 2016-03-24 DIAGNOSIS — H5712 Ocular pain, left eye: Secondary | ICD-10-CM

## 2016-03-24 DIAGNOSIS — T1592XA Foreign body on external eye, part unspecified, left eye, initial encounter: Secondary | ICD-10-CM

## 2016-03-24 NOTE — Progress Notes (Signed)
Ashley Frederick  MRN: [id number] DOB: 1940-07-10  PCP: Pcp Not In System  Subjective:  Pt is a 75 year old female, PMH HTN, hypothyroidism, diabetes, arthritis, osteoporosis, and HLD  who presents to clinic for itchy eye. Mobile interpreter used today: [phone]  Two days ago her left eye started itching. "feels like there's something inside it" Feels like there is hair or something inside it. Her pain is 6/10. Notes a mild change in vision, describes it as constant blurry vision. Has tried washing it out with tap water. She is using eye drops in both eyes which were prescribed for her ten days ago. She is not using eye drops to left eye in the last two days as it causes burning sensation.  Is scheduled for right eye surgery later this month. She is unsure of what surgery is for.  Denies redness, sensitivity to light, swelling, drainage.    Review of Systems  Constitutional: Negative for appetite change, chills, fatigue and fever.  HENT: Negative for congestion, dental problem, ear pain, postnasal drip, rhinorrhea and sinus pressure.   Eyes: Positive for pain, itching and visual disturbance (blurry ). Negative for photophobia, discharge and redness.  Cardiovascular: Negative for chest pain and palpitations.  Skin: Negative for color change, rash and wound.  Neurological: Negative for dizziness, light-headedness and headaches.    Patient Active Problem List   Diagnosis Date Noted  . Rash 04/17/2012  . Vitamin B12 deficiency 05/15/2011  . Diabetes mellitus, type 2 ([place]) 12/19/2010  . SYNCOPE 07/19/2009  . FATIGUE 06/06/2009  . HYPOTHYROIDISM 03/12/2009  . DYSLIPIDEMIA 03/12/2009  . HYPERTENSION 03/12/2009  . ARTHRITIS, RIGHT KNEE 03/12/2009  . OSTEOPOROSIS 03/12/2009    Current Outpatient Prescriptions on File Prior to Visit  Medication Sig Dispense Refill  . amLODipine (NORVASC) 5 MG tablet TAKE 1 TABLET (5 MG TOTAL) BY MOUTH DAILY. 90 tablet 1  . aspirin 81 MG chewable tablet Chew  81 mg by mouth daily.    Marland Kitchen atenolol (TENORMIN) 100 MG tablet Take 100 mg by mouth daily.    Marland Kitchen atenolol (TENORMIN) 100 MG tablet TAKE 1 TABLET BY MOUTH EVERY DAY 90 tablet 0  . cephALEXin (KEFLEX) 500 MG capsule Take 1 capsule (500 mg total) by mouth 2 (two) times daily. 10 capsule 0  . cetirizine (ZYRTEC) 10 MG tablet TAKE 1 TABLET (10 MG TOTAL) BY MOUTH DAILY AND THEN AS NEEDED (Patient not taking: Reported on 04/05/2015) 30 tablet 2  . furosemide (LASIX) 20 MG tablet Take 1 tablet (20 mg total) by mouth daily. (Patient not taking: Reported on 04/05/2015) 5 tablet 0  . glucose blood (FREESTYLE INSULINX TEST) test strip Use to check blood sugar twice a day. Dx E11.9 100 each 5  . guaiFENesin-codeine (ROBITUSSIN AC) 100-10 MG/5ML syrup Take 5 mLs by mouth 4 (four) times daily as needed for cough.    Marland Kitchen HYDROcodone-acetaminophen (NORCO) 5-325 MG per tablet Take 1 tablet by mouth every 6 (six) hours as needed for pain. (Patient not taking: Reported on 04/05/2015) 30 tablet 0  . ibuprofen (ADVIL,MOTRIN) 200 MG tablet Take 400 mg by mouth daily as needed. For pain    . Lancets (FREESTYLE) lancets [place] 100 each 2  . levothyroxine (SYNTHROID, LEVOTHROID) 75 MCG tablet Take 75 mcg by mouth daily.    . metFORMIN (GLUCOPHAGE-XR) 500 MG 24 hr tablet Take 500 mg by mouth daily with breakfast.     No current facility-administered medications on file prior to visit.  Allergies  Allergen Reactions  . Hctz [Hydrochlorothiazide] Rash     Objective:  There were no vitals taken for this visit.  Physical Exam  Constitutional: She is oriented to person, place, and time and well-developed, well-nourished, and in no distress. No distress.  Eyes: Conjunctivae are normal. Pupils are equal, round, and reactive to light.  Cardiovascular: Normal rate, regular rhythm and normal heart sounds.   Neurological: She is alert and oriented to person, place, and time. GCS score is 15.  Skin: Skin is warm and dry.    Psychiatric: Mood, memory, affect and judgment normal.  Vitals reviewed.  Procedure Verbal consent obtained. Left eye numbed with two drops Proparacaine. Fluorescein applied to left eye. Blue light examination revealed 1 cm foreign body, appears to be a thread of some sort, with lid eversion. FB removed with wet q-tip. No ulceration, lacerations or abrasion noted to conjunctiva or cornea. Eye wiped clean with saline gauze.  Assessment and Plan :  1. Pain of left eye 2. Foreign body, eye, left, initial encounter - FB identified and removed with fluorescein eye exam. Patient states her eye feels much better and reports decreased blurriness. Eye care discussed with patient. RTC if symptoms return/worsen.    Mercer Pod, PA-C  [facility] 03/24/2016 10:02 AM

## 2016-03-25 ENCOUNTER — Ambulatory Visit (INDEPENDENT_AMBULATORY_CARE_PROVIDER_SITE_OTHER): Payer: Medicare Other | Admitting: Physician Assistant

## 2016-03-25 VITALS — BP 104/72 | HR 67 | Temp 98.8°F | Resp 16 | Wt 191.0 lb

## 2016-03-25 DIAGNOSIS — H00014 Hordeolum externum left upper eyelid: Secondary | ICD-10-CM

## 2016-03-25 NOTE — Patient Instructions (Addendum)
Johnson and johnson baby shampoo - keep the tears out for the stye - warm compresses    IF you received an x-ray today, you will receive an invoice from [facility]. Please contact [facility] at [phone] with questions or concerns regarding your invoice.   IF you received labwork today, you will receive an invoice from [employer]. Please contact [facility] at [phone] with questions or concerns regarding your invoice.   Our billing staff will not be able to assist you with questions regarding bills from these companies.  You will be contacted with the lab results as soon as they are available. The fastest way to get your results is to activate your My Chart account. Instructions are located on the last page of this paperwork. If you have not heard from [place] regarding the results in 2 weeks, please contact this office.

## 2016-03-25 NOTE — Progress Notes (Signed)
Ashley Frederick  MRN: [id number] DOB: [birth year]  Subjective:  Pt presents to clinic for recheck of her left eye.  She was seen yesterday and had a FB removed from her eye with a cotton swab and felt much better afterwards but then she went to bed last night.  When she woke up this am the eye was matted shut and the upper lid is tender and swollen.  She has noticed no drainage since then.  She is having no trouble seeing out of the eye.  She has done nothing to the eye other than the eye gtts that she uses from the ophthalmologist.    Dorris Carnes [phone] - [facility] interpretor service -- husband does most of the talking even with the interpretor  Review of Systems  Eyes: Positive for discharge. Negative for photophobia, pain, redness, itching and visual disturbance.    Patient Active Problem List   Diagnosis Date Noted  . Rash 04/17/2012  . Vitamin B12 deficiency 05/15/2011  . Diabetes mellitus, type 2 ([place]) 12/19/2010  . SYNCOPE 07/19/2009  . FATIGUE 06/06/2009  . HYPOTHYROIDISM 03/12/2009  . DYSLIPIDEMIA 03/12/2009  . HYPERTENSION 03/12/2009  . ARTHRITIS, RIGHT KNEE 03/12/2009  . OSTEOPOROSIS 03/12/2009    Current Outpatient Prescriptions on File Prior to Visit  Medication Sig Dispense Refill  . amLODipine (NORVASC) 5 MG tablet TAKE 1 TABLET (5 MG TOTAL) BY MOUTH DAILY. 90 tablet 1  . aspirin 81 MG chewable tablet Chew 81 mg by mouth daily.    Marland Kitchen atenolol (TENORMIN) 100 MG tablet TAKE 1 TABLET BY MOUTH EVERY DAY 90 tablet 0  . glucose blood (FREESTYLE INSULINX TEST) test strip Use to check blood sugar twice a day. Dx E11.9 100 each 5  . Lancets (FREESTYLE) lancets [place] 100 each 2  . levothyroxine (SYNTHROID, LEVOTHROID) 75 MCG tablet Take 75 mcg by mouth daily.    . metFORMIN (GLUCOPHAGE-XR) 500 MG 24 hr tablet Take 500 mg by mouth daily with breakfast.     No current facility-administered medications on file prior to visit.     Allergies  Allergen Reactions  . Hctz  [Hydrochlorothiazide] Rash    Pt patients past, family and social history were reviewed and updated.   Objective:  BP 104/72 (BP Location: Right Arm, Patient Position: Sitting, Cuff Size: Large)   Pulse 67   Temp 98.8 F (37.1 C)   Resp 16   Wt 191 lb (86.6 kg)   SpO2 96%   BMI 36.09 kg/m   Physical Exam  Constitutional: She is oriented to person, place, and time and well-developed, well-nourished, and in no distress.  HENT:  Head: Normocephalic and atraumatic.  Right Ear: Hearing and external ear normal.  Left Ear: Hearing and external ear normal.  Eyes: Conjunctivae and EOM are normal. Pupils are equal, round, and reactive to light.    Neck: Normal range of motion.  Pulmonary/Chest: Effort normal.  Neurological: She is alert and oriented to person, place, and time. Gait normal.  Skin: Skin is warm and dry.  Psychiatric: Mood, memory, affect and judgment normal.  Vitals reviewed.    Visual Acuity Screening   Right eye Left eye Both eyes  Without correction: 20/40 20/50 20/30   With correction:       Assessment and Plan :  Hordeolum externum of left upper eyelid   Warm compresses and baby shampoo washes for the next 48-72h of worsening RTC for abx and possible need for ophthalmology referral - questions were answered and she agrees with  the plan  Windell Hummingbird PA-C  [facility] 03/25/2016 7:14 PM

## 2016-03-27 DIAGNOSIS — H2511 Age-related nuclear cataract, right eye: Secondary | ICD-10-CM | POA: Diagnosis not present

## 2016-03-28 DIAGNOSIS — H2511 Age-related nuclear cataract, right eye: Secondary | ICD-10-CM | POA: Diagnosis not present

## 2016-04-29 DIAGNOSIS — H2512 Age-related nuclear cataract, left eye: Secondary | ICD-10-CM | POA: Diagnosis not present

## 2016-05-01 DIAGNOSIS — H2512 Age-related nuclear cataract, left eye: Secondary | ICD-10-CM | POA: Diagnosis not present

## 2016-05-19 ENCOUNTER — Ambulatory Visit: Payer: Medicare Other

## 2016-05-21 ENCOUNTER — Ambulatory Visit (INDEPENDENT_AMBULATORY_CARE_PROVIDER_SITE_OTHER): Payer: Medicare Other | Admitting: Urgent Care

## 2016-05-21 VITALS — BP 130/80 | HR 64 | Temp 98.1°F | Ht 61.0 in | Wt 193.2 lb

## 2016-05-21 DIAGNOSIS — E119 Type 2 diabetes mellitus without complications: Secondary | ICD-10-CM

## 2016-05-21 DIAGNOSIS — M810 Age-related osteoporosis without current pathological fracture: Secondary | ICD-10-CM | POA: Diagnosis not present

## 2016-05-21 DIAGNOSIS — E039 Hypothyroidism, unspecified: Secondary | ICD-10-CM

## 2016-05-21 DIAGNOSIS — E785 Hyperlipidemia, unspecified: Secondary | ICD-10-CM

## 2016-05-21 DIAGNOSIS — I10 Essential (primary) hypertension: Secondary | ICD-10-CM

## 2016-05-21 NOTE — Patient Instructions (Signed)
     IF you received an x-ray today, you will receive an invoice from [facility]. Please contact [facility] at [phone] with questions or concerns regarding your invoice.   IF you received labwork today, you will receive an invoice from [facility]. Please contact [facility] at [phone] with questions or concerns regarding your invoice.   Our billing staff will not be able to assist you with questions regarding bills from these companies.  You will be contacted with the lab results as soon as they are available. The fastest way to get your results is to activate your My Chart account. Instructions are located on the last page of this paperwork. If you have not heard from us regarding the results in 2 weeks, please contact this office.     

## 2016-05-21 NOTE — Progress Notes (Signed)
    MRN: [id number] DOB: 1940-12-01  Subjective:   Ashley Frederick is a 76 y.o. female presenting for follow up on Diabetes, HTN, HL, osteoporosis.   Diabetes - Diagnosis was made 3 years ago, 2015. Managed well with Metformin once daily. Fasting blood sugar is generally less than 150's. Patient is currently being managed by ophthalmology, has visit in 05/2016 for cataracts. She got refills for Metformin 1,000mg  from Dr. Kristie Cowman with [facility] in April 2017. Last refilled 05/17/2016. Diet is diabetic friendly.   HTN - Managed with atenolol, losartan, amlodipine. Amlodipine was just refilled 05/06/2016. Losartan was last refilled 03/24/2016. States that she needs a medication refill for atenolol, but her pharmacy does not confirm that she is on this. Blood pressure checks are generally [id number] systolic. Denies smoking cigarettes or drinking alcohol.   HL - Managed with atorvastatin. Last refilled 03/24/2016.  Hypothyroidism - Has a history of hypothyroidism, s/p thyroidectomy. Managed with levothyroxine 67mcg, last refilled 05/17/2016. Has done well with this dose.  Osteoporosis - Managed with Vitamin D, Fosamax. Fosamax was last refilled 04/14/2016.   Ashley Frederick has a current medication list which includes the following prescription(s): alendronate, amlodipine, aspirin, atenolol, atorvastatin, vitamin d3, glucose blood, freestyle, levothyroxine, losartan, meloxicam, and metformin. Also is allergic to hctz [hydrochlorothiazide].  Ashley Frederick  has a past medical history of Diabetes mellitus, type 2 ([place]); DYSLIPIDEMIA; HYPERTENSION; HYPOTHYROIDISM; OA (osteoarthritis) of knee; and OSTEOPOROSIS. Also  has a past surgical history that includes No past surgeries and Thyroid surgery.  Objective:   Vitals: BP 130/80 (BP Location: Right Arm, Patient Position: Sitting, Cuff Size: Small)   Pulse 64   Temp 98.1 F (36.7 C) (Oral)   Ht 5\' 1"  (1.549 m)   Wt 193 lb 3.2 oz (87.6 kg)    SpO2 95%   BMI 36.50 kg/m   BP Readings from Last 3 Encounters:  05/21/16 130/80  03/25/16 104/72  04/05/15 118/80    Physical Exam  Constitutional: She is oriented to person, place, and time. She appears well-developed and well-nourished.  HENT:  Mouth/Throat: Oropharynx is clear and moist.  Eyes: No scleral icterus.  Neck: Normal range of motion. Neck supple. No thyromegaly present.  Cardiovascular: Normal rate, regular rhythm and intact distal pulses.  Exam reveals no gallop and no friction rub.   No murmur heard. Pulmonary/Chest: No respiratory distress. She has no wheezes. She has no rales.  Abdominal: Soft. Bowel sounds are normal. She exhibits no distension and no mass. There is no tenderness. There is no guarding.  Neurological: She is alert and oriented to person, place, and time.  Skin: Skin is warm and dry.   Assessment and Plan :   Patient plans on establishing care with me, will set up follow up appointment for lab review at [facility] ([id number]) for 05/29/2016.  1. Controlled type 2 diabetes mellitus without complication, without long-term current use of insulin ([place]) - Controlled, labs pending. Continue current regimen.  2. Essential hypertension - Well-controlled, continue current regimen. Basic metabolic panel pending.  3. Hypothyroidism, unspecified type - Thyroid Panel With TSH pending.  4. Hyperlipidemia, unspecified hyperlipidemia type - Lipid panel pending.  5. Osteoporosis, unspecified osteoporosis type, unspecified pathological fracture presence - Labs pending, will review labs and medications at next visit.  [name], PA-C [facility] [phone] 05/21/2016 3:23 PM

## 2016-05-22 LAB — THYROID PANEL WITH TSH
FREE THYROXINE INDEX: 2.8 (ref 1.2–4.9)
T3 Uptake Ratio: 28 % (ref 24–39)
T4 TOTAL: 10.1 ug/dL (ref 4.5–12.0)
TSH: 3.46 u[IU]/mL (ref 0.450–4.500)

## 2016-05-22 LAB — BASIC METABOLIC PANEL
BUN/Creatinine Ratio: 29 — ABNORMAL HIGH (ref 12–28)
BUN: 23 mg/dL (ref 8–27)
CHLORIDE: 97 mmol/L (ref 96–106)
CO2: 24 mmol/L (ref 18–29)
Calcium: 10 mg/dL (ref 8.7–10.3)
Creatinine, Ser: 0.78 mg/dL (ref 0.57–1.00)
GFR calc Af Amer: 87 mL/min/{1.73_m2} (ref >59)
GFR calc non Af Amer: 75 mL/min/{1.73_m2} (ref >59)
GLUCOSE: 130 mg/dL — AB (ref 65–99)
Potassium: 4.6 mmol/L (ref 3.5–5.2)
Sodium: 138 mmol/L (ref 134–144)

## 2016-05-22 LAB — LIPID PANEL
CHOLESTEROL TOTAL: 195 mg/dL (ref 100–199)
Chol/HDL Ratio: 3.8 ratio units (ref 0.0–4.4)
HDL: 52 mg/dL (ref >39)
LDL Calculated: 110 mg/dL — ABNORMAL HIGH (ref 0–99)
Triglycerides: 165 mg/dL — ABNORMAL HIGH (ref 0–149)
VLDL Cholesterol Cal: 33 mg/dL (ref 5–40)

## 2016-05-22 LAB — HEMOGLOBIN A1C
Est. average glucose Bld gHb Est-mCnc: 143 mg/dL
Hgb A1c MFr Bld: 6.6 % — ABNORMAL HIGH (ref 4.8–5.6)

## 2016-05-29 ENCOUNTER — Ambulatory Visit (INDEPENDENT_AMBULATORY_CARE_PROVIDER_SITE_OTHER): Payer: Medicare Other | Admitting: Urgent Care

## 2016-05-29 ENCOUNTER — Encounter: Payer: Self-pay | Admitting: Urgent Care

## 2016-05-29 VITALS — BP 139/81 | HR 58 | Temp 97.9°F | Resp 16 | Ht 60.5 in | Wt 186.0 lb

## 2016-05-29 DIAGNOSIS — E119 Type 2 diabetes mellitus without complications: Secondary | ICD-10-CM

## 2016-05-29 DIAGNOSIS — I10 Essential (primary) hypertension: Secondary | ICD-10-CM | POA: Diagnosis not present

## 2016-05-29 DIAGNOSIS — E039 Hypothyroidism, unspecified: Secondary | ICD-10-CM | POA: Diagnosis not present

## 2016-05-29 DIAGNOSIS — R05 Cough: Secondary | ICD-10-CM

## 2016-05-29 DIAGNOSIS — M810 Age-related osteoporosis without current pathological fracture: Secondary | ICD-10-CM | POA: Diagnosis not present

## 2016-05-29 DIAGNOSIS — E785 Hyperlipidemia, unspecified: Secondary | ICD-10-CM

## 2016-05-29 DIAGNOSIS — R059 Cough, unspecified: Secondary | ICD-10-CM

## 2016-05-29 MED ORDER — BENZONATATE 100 MG PO CAPS: 100.0000 mg | ORAL_CAPSULE | Freq: Three times a day (TID) | ORAL | 0 refills | Status: DC | PRN

## 2016-05-29 NOTE — Progress Notes (Signed)
    MRN: [id number] DOB: 1940-12-29  Subjective:   Ashley Frederick is a 76 y.o. female presenting for follow up on lab review. Patient had labs drawn on 05/21/2016. She obtained medication refills from her doctors at [facility]. She was wanting a medication refill for atenolol at her last visit but she just got that earlier this week. Last bone scan was done with [facility] on 03/18/2016 with the following findings:  HIP L.FEM.NECK 03/18/16 0.505 -3.1  L.TOTAL HIP 03/18/16 0.678 -2.2  FOREARM L/R 33% 03/18/16 0.517 -2.9  This confirmed her diagnosis of osteoporosis. She had her Fosamax refilled in 04/14/2016.  Ashley Frederick has a current medication list which includes the following prescription(s): alendronate, amlodipine, aspirin, atenolol, atorvastatin, vitamin d3, glucose blood, freestyle, levothyroxine, losartan, meloxicam, and metformin. Also is allergic to hctz [hydrochlorothiazide].  Ashley Frederick  has a past medical history of Diabetes mellitus, type 2 ([place]); DYSLIPIDEMIA; HYPERTENSION; HYPOTHYROIDISM; OA (osteoarthritis) of knee; and OSTEOPOROSIS. Also  has a past surgical history that includes No past surgeries and Thyroid surgery.  Objective:   Vitals: BP 139/81 (BP Location: Right Arm, Patient Position: Sitting, Cuff Size: Normal)   Pulse (!) 58   Temp 97.9 F (36.6 C) (Oral)   Resp 16   Ht 5' 0.5" (1.537 m)   Wt 186 lb (84.4 kg)   SpO2 97%   BMI 35.73 kg/m   Physical Exam  Constitutional: She is oriented to person, place, and time. She appears well-developed and well-nourished.  Cardiovascular: Normal rate.   Pulmonary/Chest: Effort normal.  Neurological: She is alert and oriented to person, place, and time.   Assessment and Plan :   1. Controlled type 2 diabetes mellitus without complication, without long-term current use of insulin ([place]) 2. Essential hypertension 3. Hypothyroidism, unspecified type 4. Hyperlipidemia, unspecified hyperlipidemia type 5.  Osteoporosis, unspecified osteoporosis type, unspecified pathological fracture presence - Well controlled. No medication refills at this time are needed. However, patient may call for future refills over the next 6 months or if the pharmacy requests these. Labs were reviewed with the patient today. I also reviewed compliance with her metformin which she was taking inconsistently QD or BID. Plan is to follow up in 6 months.  6. Cough - At the end of her visit, the patient reported a mild cough and wanted cough medication. I offered Tessalon and recommended patient return to walk in clinic where we could do a chest x-ray if necessary. Physical exam findings reassuring for today.  Ashley Eagles, PA-C [facility] [phone] 05/29/2016 8:27 AM

## 2016-05-29 NOTE — Patient Instructions (Addendum)
Call your pharmacy first if you need medication refills and if you do not have any more left, then call us and I will refill them for you.  Check your blood pressure once weekly including your pulse and write down your readings. If your pulse remains less than 60, I need to know so call our clinic and we will discuss what needs to happen.     IF you received an x-ray today, you will receive an invoice from [facility]. Please contact [facility] at [phone] with questions or concerns regarding your invoice.   IF you received labwork today, you will receive an invoice from [facility]. Please contact [facility] at [phone] with questions or concerns regarding your invoice.   Our billing staff will not be able to assist you with questions regarding bills from these companies.  You will be contacted with the lab results as soon as they are available. The fastest way to get your results is to activate your My Chart account. Instructions are located on the last page of this paperwork. If you have not heard from [place] regarding the results in 2 weeks, please contact this office.        IF you received an x-ray today, you will receive an invoice from [facility]. Please contact [facility] at [phone] with questions or concerns regarding your invoice.   IF you received labwork today, you will receive an invoice from [facility]. Please contact [facility] at [phone] with questions or concerns regarding your invoice.   Our billing staff will not be able to assist you with questions regarding bills from these companies.  You will be contacted with the lab results as soon as they are available. The fastest way to get your results is to activate your My Chart account. Instructions are located on the last page of this paperwork. If you have not heard from [place] regarding the results in 2 weeks, please contact this office.

## 2016-09-10 DIAGNOSIS — H04123 Dry eye syndrome of bilateral lacrimal glands: Secondary | ICD-10-CM | POA: Diagnosis not present

## 2016-09-10 DIAGNOSIS — H11153 Pinguecula, bilateral: Secondary | ICD-10-CM | POA: Diagnosis not present

## 2016-09-10 DIAGNOSIS — H40013 Open angle with borderline findings, low risk, bilateral: Secondary | ICD-10-CM | POA: Diagnosis not present

## 2016-09-10 DIAGNOSIS — H40003 Preglaucoma, unspecified, bilateral: Secondary | ICD-10-CM | POA: Diagnosis not present

## 2016-09-10 DIAGNOSIS — H18413 Arcus senilis, bilateral: Secondary | ICD-10-CM | POA: Diagnosis not present

## 2016-09-16 ENCOUNTER — Ambulatory Visit (INDEPENDENT_AMBULATORY_CARE_PROVIDER_SITE_OTHER): Payer: Medicare Other | Admitting: Family Medicine

## 2016-09-16 ENCOUNTER — Encounter: Payer: Self-pay | Admitting: Family Medicine

## 2016-09-16 VITALS — BP 147/85 | HR 67 | Temp 97.9°F | Resp 18 | Ht 60.0 in | Wt 192.8 lb

## 2016-09-16 DIAGNOSIS — E034 Atrophy of thyroid (acquired): Secondary | ICD-10-CM | POA: Diagnosis not present

## 2016-09-16 DIAGNOSIS — E119 Type 2 diabetes mellitus without complications: Secondary | ICD-10-CM

## 2016-09-16 DIAGNOSIS — M810 Age-related osteoporosis without current pathological fracture: Secondary | ICD-10-CM

## 2016-09-16 DIAGNOSIS — E538 Deficiency of other specified B group vitamins: Secondary | ICD-10-CM

## 2016-09-16 DIAGNOSIS — E7800 Pure hypercholesterolemia, unspecified: Secondary | ICD-10-CM | POA: Diagnosis not present

## 2016-09-16 DIAGNOSIS — I10 Essential (primary) hypertension: Secondary | ICD-10-CM | POA: Diagnosis not present

## 2016-09-16 LAB — POCT URINALYSIS DIP (MANUAL ENTRY)
BILIRUBIN UA: NEGATIVE mg/dL
Bilirubin, UA: NEGATIVE
GLUCOSE UA: NEGATIVE mg/dL
Leukocytes, UA: NEGATIVE
Nitrite, UA: NEGATIVE
Protein Ur, POC: 100 mg/dL — AB
Spec Grav, UA: >=1.03 — AB (ref 1.010–1.025)
Urobilinogen, UA: 0.2 E.U./dL
pH, UA: 6.5 (ref 5.0–8.0)

## 2016-09-16 MED ORDER — FREESTYLE LANCETS MISC: 2 refills | Status: DC

## 2016-09-16 MED ORDER — GLUCOSE BLOOD VI STRP: ORAL_STRIP | 5 refills | Status: DC

## 2016-09-16 MED ORDER — ATENOLOL 100 MG PO TABS: ORAL_TABLET | ORAL | 0 refills | Status: DC

## 2016-09-16 NOTE — Patient Instructions (Signed)
     IF you received an x-ray today, you will receive an invoice from [facility]. Please contact [facility] at [phone] with questions or concerns regarding your invoice.   IF you received labwork today, you will receive an invoice from [facility]. Please contact [facility] at [phone] with questions or concerns regarding your invoice.   Our billing staff will not be able to assist you with questions regarding bills from these companies.  You will be contacted with the lab results as soon as they are available. The fastest way to get your results is to activate your My Chart account. Instructions are located on the last page of this paperwork. If you have not heard from us regarding the results in 2 weeks, please contact this office.     

## 2016-09-16 NOTE — Progress Notes (Signed)
Subjective:    Patient ID: Ashley Frederick, female    DOB: [birth year], 76 y.o.   MRN: [id number]  09/16/2016  Follow-up (diabetes)   HPI This 76 y.o. female presents to establish care and follow-up for DMII, hypothyroidism, ostoeporosis.  Husband reports significant swelling in legs chronically.  Does have spasms in hands and feet.  Leg cramps at night.   Patient reports good compliance with medication, good tolerance to medication, and good symptom control.   Sugars running 135 non fasting.  Metformin 1000mg  once daily only.   Not taking Meloxicam yet husand is  requesting; has knee pain; taking injection in knees by [facility] every 6 months.  Hypothyroidism: Patient reports good compliance with medication, good tolerance to medication, and good symptom control.  S/p thyroidectomy [facility] for goiter.  Hypercholesterolemia: Patient reports good compliance with medication, good tolerance to medication, and good symptom control.  Lipitor 20mg  daily.  HTN: Patient reports good compliance with medication, good tolerance to medication, and good symptom control.  Atenolol and Losartan daily.  Amlodipine daily.  Osteoporosis:  Patient reports good compliance with medication, good tolerance to medication, and good symptom control.  Fosamax weekly.      Immunization History  Administered Date(s) Administered  . Influenza Split 02/12/2011, 02/26/2012  . Influenza Whole 01/23/2010   BP Readings from Last 3 Encounters:  09/16/16 (!) 147/85  05/29/16 139/81  05/21/16 130/80   Wt Readings from Last 3 Encounters:  09/16/16 192 lb 12.8 oz (87.5 kg)  05/29/16 186 lb (84.4 kg)  05/21/16 193 lb 3.2 oz (87.6 kg)    Review of Systems  Constitutional: Negative for chills, diaphoresis, fatigue and fever.  Eyes: Negative for visual disturbance.  Respiratory: Negative for cough and shortness of breath.   Cardiovascular: Positive for leg swelling. Negative for chest pain and palpitations.    Gastrointestinal: Negative for abdominal pain, constipation, diarrhea, nausea and vomiting.  Endocrine: Negative for cold intolerance, heat intolerance, polydipsia, polyphagia and polyuria.  Neurological: Negative for dizziness, tremors, seizures, syncope, facial asymmetry, speech difficulty, weakness, light-headedness, numbness and headaches.    Past Medical History:  Diagnosis Date  . Diabetes mellitus, type 2 ([place])   . DYSLIPIDEMIA   . HYPERTENSION   . HYPOTHYROIDISM   . OA (osteoarthritis) of knee   . OSTEOPOROSIS    Past Surgical History:  Procedure Laterality Date  . NO PAST SURGERIES    . THYROID SURGERY     Allergies  Allergen Reactions  . Hctz [Hydrochlorothiazide] Rash    Social History   Social History  . Marital status: Married    Spouse name: N/A  . Number of children: N/A  . Years of education: N/A   Occupational History  . Not on file.   Social History Main Topics  . Smoking status: Never Smoker  . Smokeless tobacco: Never Used     Comment: Moved to [place] to be closer to family- lives with son  . Alcohol use No  . Drug use: No  . Sexual activity: Not on file   Other Topics Concern  . Not on file   Social History Narrative  . No narrative on file   No family history on file.     Objective:    BP (!) 147/85 (BP Location: Right Arm, Patient Position: Sitting, Cuff Size: Large)   Pulse 67   Temp 97.9 F (36.6 C) (Oral)   Resp 18   Ht 5' (1.524 m)   Wt 192 lb  12.8 oz (87.5 kg)   SpO2 96%   BMI 37.65 kg/m  Physical Exam  Constitutional: She is oriented to person, place, and time. She appears well-developed and well-nourished. No distress.  HENT:  Head: Normocephalic and atraumatic.  Right Ear: External ear normal.  Left Ear: External ear normal.  Nose: Nose normal.  Mouth/Throat: Oropharynx is clear and moist.  Eyes: Conjunctivae and EOM are normal. Pupils are equal, round, and reactive to light.  Neck: Normal range of motion.  Neck supple. Carotid bruit is not present. No thyromegaly present.  Well-healed incision horizontal anterior lower neck.  Cardiovascular: Normal rate, regular rhythm, normal heart sounds and intact distal pulses.  Exam reveals no gallop and no friction rub.   No murmur heard. 2+ pitting and non-pitting edema BLE.  Pulmonary/Chest: Effort normal and breath sounds normal. She has no wheezes. She has no rales.  Abdominal: Soft. Bowel sounds are normal. She exhibits no distension and no mass. There is no tenderness. There is no rebound and no guarding.  Musculoskeletal: She exhibits edema.  Lymphadenopathy:    She has no cervical adenopathy.  Neurological: She is alert and oriented to person, place, and time. No cranial nerve deficit.  Skin: Skin is warm and dry. No rash noted. She is not diaphoretic. No erythema. No pallor.  Psychiatric: She has a normal mood and affect. Her behavior is normal.   Depression screen [facility] 2/9 09/16/2016 05/29/2016 05/21/2016 03/25/2016 04/05/2015  Decreased Interest 0 0 0 0 0  Down, Depressed, Hopeless 0 - 0 0 0  PHQ - 2 Score 0 0 0 0 0   Fall Risk  09/16/2016 05/29/2016 05/21/2016 03/25/2016  Falls in the past year? No Yes No No  Number falls in past yr: - 2 or more - -  Injury with Fall? - No - -        Assessment & Plan:   1. Type 2 diabetes mellitus without complication, without long-term current use of insulin ([place])   2. Hypothyroidism due to acquired atrophy of thyroid   3. Essential hypertension   4. Age-related osteoporosis without current pathological fracture   5. Vitamin B12 deficiency   6. Pure hypercholesterolemia    -controlled chronic medical conditions; obtain labs; refills provided. -reviewed medical history in detail during visit.   Orders Placed This Encounter  Procedures  . CBC with Differential/Platelet  . Comprehensive metabolic panel    Order Specific Question:   Has the patient fasted?    Answer:   Yes  . Hemoglobin A1c  . Lipid  panel    Order Specific Question:   Has the patient fasted?    Answer:   Yes  . TSH  . T4, free  . Microalbumin, urine  . POCT urinalysis dipstick   Meds ordered this encounter  Medications  . Lancets (FREESTYLE) lancets    Sig: [place]    Dispense:  100 each    Refill:  2  . glucose blood (FREESTYLE INSULINX TEST) test strip    Sig: Use to check blood sugar twice a day. Dx E11.9    Dispense:  100 each    Refill:  5    Dx E11.9  . DISCONTD: atenolol (TENORMIN) 100 MG tablet    Sig: TAKE 1 TABLET BY MOUTH EVERY DAY    Dispense:  90 tablet    Refill:  0    Needs annual exam for any additional refills.  Marland Kitchen atenolol (TENORMIN) 100 MG tablet    Sig:  TAKE 1 TABLET BY MOUTH EVERY DAY    Dispense:  90 tablet    Refill:  1    Return in about 3 months (around 12/17/2016) for recheck.   Jolita Haefner Elayne Guerin, M.D. Primary [facility] at [facility] [address] [place], [place]  [address] [phone] phone [phone] fax

## 2016-09-17 LAB — CBC WITH DIFFERENTIAL/PLATELET
BASOS ABS: 0 10*3/uL (ref 0.0–0.2)
Basos: 0 %
EOS (ABSOLUTE): 0.3 10*3/uL (ref 0.0–0.4)
EOS: 4 %
HEMATOCRIT: 37.9 % (ref 34.0–46.6)
HEMOGLOBIN: 12.3 g/dL (ref 11.1–15.9)
Immature Grans (Abs): 0 10*3/uL (ref 0.0–0.1)
Immature Granulocytes: 0 %
LYMPHS ABS: 2.1 10*3/uL (ref 0.7–3.1)
Lymphs: 32 %
MCH: 26.5 pg — AB (ref 26.6–33.0)
MCHC: 32.5 g/dL (ref 31.5–35.7)
MCV: 82 fL (ref 79–97)
MONOCYTES: 6 %
Monocytes Absolute: 0.4 10*3/uL (ref 0.1–0.9)
NEUTROS ABS: 3.8 10*3/uL (ref 1.4–7.0)
Neutrophils: 58 %
Platelets: 302 10*3/uL (ref 150–379)
RBC: 4.65 x10E6/uL (ref 3.77–5.28)
RDW: 15.8 % — ABNORMAL HIGH (ref 12.3–15.4)
WBC: 6.5 10*3/uL (ref 3.4–10.8)

## 2016-09-17 LAB — LIPID PANEL
CHOL/HDL RATIO: 4.3 ratio (ref 0.0–4.4)
Cholesterol, Total: 200 mg/dL — ABNORMAL HIGH (ref 100–199)
HDL: 47 mg/dL (ref >39)
LDL Calculated: 120 mg/dL — ABNORMAL HIGH (ref 0–99)
Triglycerides: 167 mg/dL — ABNORMAL HIGH (ref 0–149)
VLDL CHOLESTEROL CAL: 33 mg/dL (ref 5–40)

## 2016-09-17 LAB — COMPREHENSIVE METABOLIC PANEL
ALBUMIN: 4.5 g/dL (ref 3.5–4.8)
ALK PHOS: 80 IU/L (ref 39–117)
ALT: 10 IU/L (ref 0–32)
AST: 13 IU/L (ref 0–40)
Albumin/Globulin Ratio: 1.5 (ref 1.2–2.2)
BUN / CREAT RATIO: 20 (ref 12–28)
BUN: 17 mg/dL (ref 8–27)
Bilirubin Total: <0.2 mg/dL (ref 0.0–1.2)
CO2: 23 mmol/L (ref 18–29)
CREATININE: 0.83 mg/dL (ref 0.57–1.00)
Calcium: 9.5 mg/dL (ref 8.7–10.3)
Chloride: 101 mmol/L (ref 96–106)
GFR calc Af Amer: 80 mL/min/{1.73_m2} (ref >59)
GFR calc non Af Amer: 69 mL/min/{1.73_m2} (ref >59)
GLOBULIN, TOTAL: 3 g/dL (ref 1.5–4.5)
Glucose: 142 mg/dL — ABNORMAL HIGH (ref 65–99)
Potassium: 4.5 mmol/L (ref 3.5–5.2)
SODIUM: 141 mmol/L (ref 134–144)
Total Protein: 7.5 g/dL (ref 6.0–8.5)

## 2016-09-17 LAB — TSH: TSH: 2.8 u[IU]/mL (ref 0.450–4.500)

## 2016-09-17 LAB — HEMOGLOBIN A1C
Est. average glucose Bld gHb Est-mCnc: 154 mg/dL
HEMOGLOBIN A1C: 7 % — AB (ref 4.8–5.6)

## 2016-09-17 LAB — T4, FREE: Free T4: 1.48 ng/dL (ref 0.82–1.77)

## 2016-09-17 LAB — MICROALBUMIN, URINE: Microalbumin, Urine: 2052.9 ug/mL

## 2016-10-08 ENCOUNTER — Other Ambulatory Visit: Payer: Self-pay | Admitting: Emergency Medicine

## 2016-10-08 MED ORDER — METFORMIN HCL 1000 MG PO TABS: 1000.0000 mg | ORAL_TABLET | Freq: Two times a day (BID) | ORAL | 1 refills | Status: DC

## 2016-10-08 MED ORDER — ATORVASTATIN CALCIUM 20 MG PO TABS: 20.0000 mg | ORAL_TABLET | Freq: Every day | ORAL | 0 refills | Status: DC

## 2016-10-08 MED ORDER — AMLODIPINE BESYLATE 10 MG PO TABS: 10.0000 mg | ORAL_TABLET | Freq: Every day | ORAL | 1 refills | Status: DC

## 2016-10-08 MED ORDER — LEVOTHYROXINE SODIUM 75 MCG PO TABS: 75.0000 ug | ORAL_TABLET | Freq: Every day | ORAL | 0 refills | Status: DC

## 2016-10-14 MED ORDER — ATENOLOL 100 MG PO TABS: ORAL_TABLET | ORAL | 1 refills | Status: DC

## 2016-10-17 ENCOUNTER — Telehealth: Payer: Self-pay | Admitting: Family Medicine

## 2016-10-17 DIAGNOSIS — M1711 Unilateral primary osteoarthritis, right knee: Secondary | ICD-10-CM

## 2016-10-17 NOTE — Telephone Encounter (Signed)
Please place referral if appropriate. Thanks!

## 2016-10-17 NOTE — Telephone Encounter (Signed)
Pt is wanting an appointment [facility] for a referral . Please advise with her son. [phone] Alnabulsi,Mahmoud

## 2016-10-17 NOTE — Telephone Encounter (Signed)
Not sure which one of you two wants to place this referral

## 2016-10-20 NOTE — Telephone Encounter (Signed)
Please call for clarification; why is patient wanting a referral to [facility]?  ( I do have in her records that she has osteoarthritis of knees and is seeing Dr. Lynann Bologna every six months for injections).  Is she needing a referral for a second opinion on knee arthritis or does she have a new issue?

## 2016-10-21 NOTE — Telephone Encounter (Signed)
FYI

## 2016-10-21 NOTE — Telephone Encounter (Signed)
Called and spoke with patient' son, his mother need a referral to [facility] / Dr. Justine Null to get her injections. She does not have any new issue.

## 2016-10-24 ENCOUNTER — Telehealth: Payer: Self-pay | Admitting: Family Medicine

## 2016-10-24 NOTE — Telephone Encounter (Signed)
Tried calling pt about referral and was asked to call her son. Called son's phone number and was unable to leave a vm because it was full. Pt's ortho referral has been sent to Dr. Lynann Bologna at [facility] on 6/29

## 2016-10-24 NOTE — Telephone Encounter (Signed)
Referral to Dr.Voytek placed; please advise son.

## 2016-10-24 NOTE — Addendum Note (Signed)
Addended by: Wardell Honour on: 10/24/2016 10:28 AM   Modules accepted: Orders

## 2016-10-26 ENCOUNTER — Encounter: Payer: Self-pay | Admitting: Family Medicine

## 2016-10-26 ENCOUNTER — Other Ambulatory Visit: Payer: Self-pay | Admitting: Family Medicine

## 2016-10-26 MED ORDER — ATORVASTATIN CALCIUM 40 MG PO TABS: 40.0000 mg | ORAL_TABLET | Freq: Every day | ORAL | 3 refills | Status: DC

## 2016-10-27 NOTE — Progress Notes (Signed)
Letter sent.

## 2016-10-28 NOTE — Telephone Encounter (Signed)
Called and spoke with patient's son , informed of referral.

## 2016-11-03 DIAGNOSIS — M19042 Primary osteoarthritis, left hand: Secondary | ICD-10-CM | POA: Diagnosis not present

## 2016-11-03 DIAGNOSIS — M1711 Unilateral primary osteoarthritis, right knee: Secondary | ICD-10-CM | POA: Diagnosis not present

## 2016-11-03 DIAGNOSIS — M19041 Primary osteoarthritis, right hand: Secondary | ICD-10-CM | POA: Diagnosis not present

## 2016-11-12 DIAGNOSIS — M1711 Unilateral primary osteoarthritis, right knee: Secondary | ICD-10-CM | POA: Diagnosis not present

## 2016-11-19 DIAGNOSIS — M1711 Unilateral primary osteoarthritis, right knee: Secondary | ICD-10-CM | POA: Diagnosis not present

## 2016-11-27 DIAGNOSIS — M1711 Unilateral primary osteoarthritis, right knee: Secondary | ICD-10-CM | POA: Diagnosis not present

## 2016-12-16 ENCOUNTER — Ambulatory Visit: Payer: Medicare Other | Admitting: Family Medicine

## 2016-12-18 ENCOUNTER — Ambulatory Visit (INDEPENDENT_AMBULATORY_CARE_PROVIDER_SITE_OTHER): Payer: Medicare Other

## 2016-12-18 ENCOUNTER — Ambulatory Visit (INDEPENDENT_AMBULATORY_CARE_PROVIDER_SITE_OTHER): Payer: Medicare Other | Admitting: Emergency Medicine

## 2016-12-18 ENCOUNTER — Encounter: Payer: Self-pay | Admitting: Emergency Medicine

## 2016-12-18 VITALS — BP 118/76 | HR 57 | Temp 98.3°F | Resp 16 | Ht 61.5 in | Wt 196.9 lb

## 2016-12-18 DIAGNOSIS — M79672 Pain in left foot: Secondary | ICD-10-CM

## 2016-12-18 DIAGNOSIS — M19072 Primary osteoarthritis, left ankle and foot: Secondary | ICD-10-CM

## 2016-12-18 MED ORDER — DICLOFENAC SODIUM 75 MG PO TBEC: 75.0000 mg | DELAYED_RELEASE_TABLET | Freq: Two times a day (BID) | ORAL | 0 refills | Status: AC

## 2016-12-18 NOTE — Patient Instructions (Addendum)
We recommend that you schedule a mammogram for breast cancer screening. Typically, you do not need a referral to do this. Please contact a local imaging center to schedule your mammogram.  [facility] - [phone]  *ask for the Radiology Department [facility] ([place]) - [phone] or [phone]  [facility] - [phone] [place] [phone] [facility] - [phone]  *ask for the [facility] - [phone]  *ask for the Radiology Department [facility] - [phone]  *ask for the [facility] - [phone]    IF you received an x-ray today, you will receive an invoice from [facility]. Please contact [facility] at [phone] with questions or concerns regarding your invoice.   IF you received labwork today, you will receive an invoice from [facility]. Please contact [facility] at [phone] with questions or concerns regarding your invoice.   Our billing staff will not be able to assist you with questions regarding bills from these companies.  You will be contacted with the lab results as soon as they are available. The fastest way to get your results is to activate your My Chart account. Instructions are located on the last page of this paperwork. If you have not heard from [place] regarding the results in 2 weeks, please contact this office.     Foot Pain Many things can cause foot pain. Some common causes are:  An injury.  A sprain.  Arthritis.  Blisters.  Bunions.  Follow these instructions at home: Pay attention to any changes in your symptoms. Take these actions to help with your discomfort:  If directed, put ice on the affected area: ? Put ice in a plastic bag. ? Place a towel between your skin and the bag. ? Leave the ice on for 15-20 minutes, 3?4 times a day for 2 days.  Take  over-the-counter and prescription medicines only as told by your health care provider.  Wear comfortable, supportive shoes that fit you well. Do not wear high heels.  Do not stand or walk for long periods of time.  Do not lift a lot of weight. This can put added pressure on your feet.  Do stretches to relieve foot pain and stiffness as told by your health care provider.  Rub your foot gently.  Keep your feet clean and dry.  Contact a health care provider if:  Your pain does not get better after a few days of self-care.  Your pain gets worse.  You cannot stand on your foot. Get help right away if:  Your foot is numb or tingling.  Your foot or toes are swollen.  Your foot or toes turn white or blue.  You have warmth and redness along your foot. This information is not intended to replace advice given to you by your health care provider. Make sure you discuss any questions you have with your health care provider. Document Released: 05/11/2015 Document Revised: 09/20/2015 Document Reviewed: 05/10/2014 Elsevier Interactive Patient Education  [employer].

## 2016-12-18 NOTE — Progress Notes (Signed)
Ashley Frederick 76 y.o.   Chief Complaint  Patient presents with  . Foot Pain    x 2-3 weeks    HISTORY OF PRESENT ILLNESS: This is a 76 y.o. female complaining of pain to left foot x several weeks; denies trauma; saw Ortho who said she has arthritic changes on her foot.  HPI   Prior to Admission medications   Medication Sig Start Date End Date Taking? Authorizing Provider  diclofenac (VOLTAREN) 75 MG EC tablet Take 1 tablet (75 mg total) by mouth 2 (two) times daily. 12/18/16 12/23/16  Horald Pollen, MD    Not on File  There are no active problems to display for this patient.   No past medical history on file.  No past surgical history on file.  Social History   Social History  . Marital status: Married    Spouse name: N/A  . Number of children: N/A  . Years of education: N/A   Occupational History  . Not on file.   Social History Main Topics  . Smoking status: Never Smoker  . Smokeless tobacco: Never Used  . Alcohol use No  . Drug use: No  . Sexual activity: Not on file   Other Topics Concern  . Not on file   Social History Narrative  . No narrative on file    No family history on file.   Review of Systems  Constitutional: Negative.  Negative for chills and fever.  Respiratory: Negative for shortness of breath.   Gastrointestinal: Negative for nausea and vomiting.  Skin: Negative.  Negative for rash.  Neurological: Negative for tingling, sensory change and focal weakness.  All other systems reviewed and are negative.   Vitals:   12/18/16 0850  BP: 118/76  Pulse: (!) 57  Resp: 16  Temp: 98.3 F (36.8 C)  SpO2: 97%    Physical Exam  Constitutional: She is oriented to person, place, and time. She appears well-developed and well-nourished.  HENT:  Head: Normocephalic and atraumatic.  Eyes: Pupils are equal, round, and reactive to light.  Neck: Normal range of motion.  Cardiovascular: Normal rate.   Pulmonary/Chest: Effort normal.   Musculoskeletal:  Left foot: NVI with FROM; warm to touch, no erythema, non-tender. Pain mostly when walking.  Neurological: She is alert and oriented to person, place, and time. No sensory deficit. She exhibits normal muscle tone.  Skin: Skin is warm and dry. Capillary refill takes less than 2 seconds.  Psychiatric: She has a normal mood and affect. Her behavior is normal.  Vitals reviewed.   Dg Foot Complete Left  Result Date: 12/18/2016 CLINICAL DATA:  Left foot pain. EXAM: LEFT FOOT - COMPLETE 3+ VIEW COMPARISON:  None. FINDINGS: Mild degenerative changes at the left [facility] joint with joint space narrowing. Diffuse osteopenia. Degenerative changes at the tarsal metatarsal joints with joint space narrowing. No acute bony abnormality. Specifically, no fracture, subluxation, or dislocation. Soft tissues are intact. IMPRESSION: No acute bony abnormality. Electronically Signed   By: Rolm Baptise M.D.   On: 12/18/2016 09:25     ASSESSMENT & PLAN: Almyra Free was seen today for foot pain.  Diagnoses and all orders for this visit:  Left foot pain -     DG Foot Complete Left; Future -     Ambulatory referral to Podiatry  Other orders -     diclofenac (VOLTAREN) 75 MG EC tablet; Take 1 tablet (75 mg total) by mouth 2 (two) times daily.    Patient Instructions  We recommend that you schedule a mammogram for breast cancer screening. Typically, you do not need a referral to do this. Please contact a local imaging center to schedule your mammogram.  [facility] - [phone]  *ask for the Radiology Department [facility] ([place]) - [phone] or [phone]  [facility] - [phone] [place] [phone] [facility] - [phone]  *ask for the [facility] - [phone]  *ask for the Radiology Department [facility] - [phone]  *ask for the [place] - [phone]    IF you received an x-ray today, you will receive an invoice from [facility]. Please contact [facility] at [phone] with questions or concerns regarding your invoice.   IF you received labwork today, you will receive an invoice from [facility]. Please contact [facility] at [phone] with questions or concerns regarding your invoice.   Our billing staff will not be able to assist you with questions regarding bills from these companies.  You will be contacted with the lab results as soon as they are available. The fastest way to get your results is to activate your My Chart account. Instructions are located on the last page of this paperwork. If you have not heard from [place] regarding the results in 2 weeks, please contact this office.     Foot Pain Many things can cause foot pain. Some common causes are:  An injury.  A sprain.  Arthritis.  Blisters.  Bunions.  Follow these instructions at home: Pay attention to any changes in your symptoms. Take these actions to help with your discomfort:  If directed, put ice on the affected area: ? Put ice in a plastic bag. ? Place a towel between your skin and the bag. ? Leave the ice on for 15-20 minutes, 3?4 times a day for 2 days.  Take over-the-counter and prescription medicines only as told by your health care provider.  Wear comfortable, supportive shoes that fit you well. Do not wear high heels.  Do not stand or walk for long periods of time.  Do not lift a lot of weight. This can put added pressure on your feet.  Do stretches to relieve foot pain and stiffness as told by your health care provider.  Rub your foot gently.  Keep your feet clean and dry.  Contact a health care provider if:  Your pain does not get better after a few days of self-care.  Your pain gets worse.  You cannot stand on your foot. Get help right away if:  Your foot is numb or  tingling.  Your foot or toes are swollen.  Your foot or toes turn white or blue.  You have warmth and redness along your foot. This information is not intended to replace advice given to you by your health care provider. Make sure you discuss any questions you have with your health care provider. Document Released: 05/11/2015 Document Revised: 09/20/2015 Document Reviewed: 05/10/2014 Elsevier Interactive Patient Education  2018 [employer].      Agustina Caroli, MD [facility]

## 2016-12-19 ENCOUNTER — Other Ambulatory Visit: Payer: Self-pay | Admitting: Family Medicine

## 2016-12-22 ENCOUNTER — Encounter: Payer: Self-pay | Admitting: Family Medicine

## 2017-01-06 ENCOUNTER — Ambulatory Visit (INDEPENDENT_AMBULATORY_CARE_PROVIDER_SITE_OTHER): Payer: Medicare Other | Admitting: Podiatry

## 2017-01-06 ENCOUNTER — Encounter: Payer: Self-pay | Admitting: Podiatry

## 2017-01-06 DIAGNOSIS — M7742 Metatarsalgia, left foot: Secondary | ICD-10-CM

## 2017-01-06 DIAGNOSIS — M216X9 Other acquired deformities of unspecified foot: Secondary | ICD-10-CM | POA: Diagnosis not present

## 2017-01-06 DIAGNOSIS — E0842 Diabetes mellitus due to underlying condition with diabetic polyneuropathy: Secondary | ICD-10-CM | POA: Diagnosis not present

## 2017-01-06 NOTE — Progress Notes (Signed)
SUBJECTIVE: 76 y.o. year old female presents complaining of pain on left foot with ambulation. She was accompanied by her son for interpretation.  X-ray examination done at PCP office and was told she has arthritis. She was given Cortisone dose pack that did not help her. Blood sugar reading was 110 yesterday.   REVIEW OF SYSTEMS: Pertinent items noted in HPI and remainder of comprehensive ROS otherwise negative.  OBJECTIVE: DERMATOLOGIC EXAMINATION: Thick dystrophic nails x 10.  VASCULAR EXAMINATION OF LOWER LIMBS: All pedal pulses are palpable with normal pulsation.  Capillary Filling times within 3 seconds in all digits.  No edema or erythema noted. Temperature gradient from tibial crest to dorsum of foot is within normal bilateral.  NEUROLOGIC EXAMINATION OF THE LOWER LIMBS: Achilles DTR is present and within normal. Failed to respond to Monofilament (Semmes-Weinstein 10-gm) sensory testing bilateral. Vibratory sensations(128Hz  turning fork) intact at medial and lateral forefoot bilateral.  Sharp and Dull discriminatory sensations at the plantar ball of hallux is intact bilateral.   MUSCULOSKELETAL EXAMINATION: Positive for high arched cavus foot. Loss of fat pad under the ball both feet.  Loss of muscle strength during plantar flexion of forefoot.   RADIOGRAPHIC STUDY OF LEFT FOOT:  AP View:  No gross deformity on AP view. Lateral view:  High arched cavus foot left.   ASSESSMENT: Lesser metatarsalgia left. Diabetic neuropathy. Pain with ambulation.  PLAN: Reviewed findings and available treatment options. Need extra depth shoes with added cushion. Diabetic shoe may benefit. OK to use Advil as tolerated. If needed further support, will try custom orthotics next.

## 2017-01-06 NOTE — Patient Instructions (Signed)
Seen for left foot pain. Noted of high arched rigid foot with pain at the MPJ with weight bearing. May benefit from Diabetic shoes to reduce load at the MPJ area. Explained to her son. Patient will bring signed certification form for diabetic shoes.

## 2017-01-12 ENCOUNTER — Ambulatory Visit: Payer: Medicare Other

## 2017-01-19 DIAGNOSIS — M84375A Stress fracture, left foot, initial encounter for fracture: Secondary | ICD-10-CM | POA: Diagnosis not present

## 2017-02-09 DIAGNOSIS — M84375D Stress fracture, left foot, subsequent encounter for fracture with routine healing: Secondary | ICD-10-CM | POA: Diagnosis not present

## 2017-02-23 DIAGNOSIS — M84375D Stress fracture, left foot, subsequent encounter for fracture with routine healing: Secondary | ICD-10-CM | POA: Diagnosis not present

## 2017-03-16 ENCOUNTER — Ambulatory Visit (INDEPENDENT_AMBULATORY_CARE_PROVIDER_SITE_OTHER): Payer: Medicare Other

## 2017-03-16 ENCOUNTER — Other Ambulatory Visit: Payer: Self-pay

## 2017-03-16 ENCOUNTER — Encounter: Payer: Self-pay | Admitting: Physician Assistant

## 2017-03-16 ENCOUNTER — Ambulatory Visit (INDEPENDENT_AMBULATORY_CARE_PROVIDER_SITE_OTHER): Payer: Medicare Other | Admitting: Physician Assistant

## 2017-03-16 VITALS — BP 138/86 | HR 61 | Temp 98.1°F | Resp 16 | Ht 60.5 in | Wt 194.6 lb

## 2017-03-16 DIAGNOSIS — R05 Cough: Secondary | ICD-10-CM | POA: Diagnosis not present

## 2017-03-16 DIAGNOSIS — R059 Cough, unspecified: Secondary | ICD-10-CM

## 2017-03-16 LAB — POCT CBC
Granulocyte percent: 67.7 %G (ref 37–80)
HEMATOCRIT: 36.3 % — AB (ref 37.7–47.9)
HEMOGLOBIN: 12 g/dL — AB (ref 12.2–16.2)
LYMPH, POC: 2.6 (ref 0.6–3.4)
MCH, POC: 26.3 pg — AB (ref 27–31.2)
MCHC: 33 g/dL (ref 31.8–35.4)
MCV: 79.9 fL — AB (ref 80–97)
MID (cbc): 0.8 (ref 0–0.9)
MPV: 7.7 fL (ref 0–99.8)
POC GRANULOCYTE: 7.1 — AB (ref 2–6.9)
POC LYMPH %: 25.1 % (ref 10–50)
POC MID %: 7.2 %M (ref 0–12)
Platelet Count, POC: 335 10*3/uL (ref 142–424)
RBC: 4.54 M/uL (ref 4.04–5.48)
RDW, POC: 14.7 %
WBC: 10.5 10*3/uL — AB (ref 4.6–10.2)

## 2017-03-16 MED ORDER — BENZONATATE 100 MG PO CAPS: 100.0000 mg | ORAL_CAPSULE | Freq: Three times a day (TID) | ORAL | 0 refills | Status: DC | PRN

## 2017-03-16 MED ORDER — AZITHROMYCIN 250 MG PO TABS: ORAL_TABLET | ORAL | 0 refills | Status: DC

## 2017-03-16 NOTE — Progress Notes (Signed)
MRN: [id number] DOB: 03-25-1941  Subjective:   Ashley Frederick is a 76 y.o. female presenting for chief complaint of Cough (nonproductive x 1 week) .  Reports one week history of sore throat and dry cough (no hemoptysis). Feels like there is sputum to cough up but she cannot get it up. Has tried honey, ginger, and herbal tea with mild relief. Denies fever, sinus congestion, rhinorrhea, wheezing, shortness of breath, chest pain and myalgia, night sweats, fatigue, nausea, vomiting, abdominal pain and diarrhea. Has not had sick contact with anyone. No history of seasonal allergies, no history of asthma or COPD. Has hx of T2DM.  Patient has had flu shot this season. Denies smoking.  In terms of initial elevated bp value, she took her medication this morning a couple of hours ago. She does not check her bp at home on a regular basis. Denies chest pain, heart palpitations, shortness of breath, nausea, vomiting, hematuria, and lower leg swelling. Thinks it is because she is not feeling well.   Ashley Frederick has a current medication list which includes the following prescription(s): alendronate, aspirin, atenolol, atorvastatin, vitamin d3, levothyroxine, losartan, metformin, amlodipine, glucose blood, freestyle, and meloxicam. Also is allergic to hctz [hydrochlorothiazide].  Ashley Frederick  has a past medical history of Diabetes mellitus, type 2 ([place]), DYSLIPIDEMIA, HYPERTENSION, HYPOTHYROIDISM, OA (osteoarthritis) of knee, and OSTEOPOROSIS. Also  has a past surgical history that includes No past surgeries and Thyroid surgery.   Objective:   Vitals: BP 138/86 (BP Location: Left Arm, Patient Position: Sitting, Cuff Size: Large)   Pulse 61   Temp 98.1 F (36.7 C) (Oral)   Resp 16   Ht 5' 0.5" (1.537 m)   Wt 194 lb 9.6 oz (88.3 kg)   SpO2 96%   BMI 37.38 kg/m   Physical Exam  Constitutional: She is oriented to person, place, and time. She appears well-developed and well-nourished. She appears distressed (  Appears uncomfortable sitting on exam table.).  HENT:  Head: Normocephalic and atraumatic.  Right Ear: Tympanic membrane, external ear and ear canal normal.  Left Ear: Tympanic membrane, external ear and ear canal normal.  Nose: Mucosal edema and rhinorrhea present. Right sinus exhibits no maxillary sinus tenderness and no frontal sinus tenderness. Left sinus exhibits no maxillary sinus tenderness and no frontal sinus tenderness.  Mouth/Throat: Uvula is midline, oropharynx is clear and moist and mucous membranes are normal. Tonsils are 1+ on the right. Tonsils are 1+ on the left. No tonsillar exudate.  Eyes: Conjunctivae are normal.  Neck: Normal range of motion.  Cardiovascular: Normal rate, regular rhythm and normal heart sounds.  Pulmonary/Chest: Effort normal. She has no wheezes. She has no rhonchi. She has no rales.  Lung exam difficult due to body habitus and coughing during exam.  Lymphadenopathy:       Head (right side): No submental, no submandibular, no tonsillar, no preauricular, no posterior auricular and no occipital adenopathy present.       Head (left side): No submental, no submandibular, no tonsillar, no preauricular, no posterior auricular and no occipital adenopathy present.    She has no cervical adenopathy.       Right: No supraclavicular adenopathy present.       Left: No supraclavicular adenopathy present.  Neurological: She is alert and oriented to person, place, and time.  Skin: Skin is warm and dry.  Psychiatric: She has a normal mood and affect.  Vitals reviewed.   Results for orders placed or performed in visit on 03/16/17 (  from the past 24 hour(s))  POCT CBC     Status: Abnormal   Collection Time: 03/16/17 11:11 AM  Result Value Ref Range   WBC 10.5 (A) 4.6 - 10.2 K/uL   Lymph, poc 2.6 0.6 - 3.4   POC LYMPH PERCENT 25.1 10 - 50 %L   MID (cbc) 0.8 0 - 0.9   POC MID % 7.2 0 - 12 %M   POC Granulocyte 7.1 (A) 2 - 6.9   Granulocyte percent 67.7 37 - 80 %G    RBC 4.54 4.04 - 5.48 M/uL   Hemoglobin 12.0 (A) 12.2 - 16.2 g/dL   HCT, POC 36.3 (A) 37.7 - 47.9 %   MCV 79.9 (A) 80 - 97 fL   MCH, POC 26.3 (A) 27 - 31.2 pg   MCHC 33.0 31.8 - 35.4 g/dL   RDW, POC 14.7 %   Platelet Count, POC 335 142 - 424 K/uL   MPV 7.7 0 - 99.8 fL    Dg Chest 2 View  Result Date: 03/16/2017 CLINICAL DATA:  77 year old female with nonproductive cough for 1 week. EXAM: CHEST  2 VIEW COMPARISON:  04/05/2015 chest and abdominal series. Lumbar CT myelogram 08/31/2013 FINDINGS: Cardiac size at the upper limits of normal. Tortuous descending thoracic aorta. Other mediastinal contours are within normal limits. Visualized tracheal air column is within normal limits. No pneumothorax, pulmonary edema, pleural effusion or consolidation. No definite confluent pulmonary opacity, but questionable increased interstitial markings. Asymmetric first and second rib costochondral calcifications. Flowing osteophytes in the thoracic spine. No acute osseous abnormality identified. Negative visible bowel gas pattern. IMPRESSION: 1. Possible bilateral increased pulmonary interstitial markings such as due to viral/atypical respiratory infection in this clinical setting. 2. No other acute cardiopulmonary abnormality. 3. Tortuous thoracic aorta. Electronically Signed   By: Genevie Ann M.D.   On: 03/16/2017 11:09    Assessment and Plan :  1. Cough Due to duration of symptoms, comorbidities, mildly elevated WBC, and plain film findings of pulmonary interstitial markings, will treat for underlying atypical bacterial etiology at this time with azithromycin.  Will also provide symptomatic relief with Tessalon Perles.  Recommended rest, oral hydration, warm mist humidifer, and eat light meals. -Return to clinic if symptoms worsen, do not improve in 7-10 days, or as needed - POCT CBC - DG Chest 2 View; Future  Meds ordered this encounter  Medications  .  benzonatate (TESSALON) 100 MG capsule    Sig: Take 1-2  capsules (100-200 mg total) 3 (three) times daily as needed by mouth for cough.    Dispense:  40 capsule    Refill:  0    Order Specific Question:   Supervising Provider    Answer:   Wardell Honour [[id number]]  .  azithromycin (ZITHROMAX) 250 MG tablet    Sig: Take 2 tabs PO x 1 dose, then 1 tab PO QD x 4 days    Dispense:  6 tablet    Refill:  0    Order Specific Question:   Supervising Provider    Answer:   Reginia Forts M [[id number]]    Tenna Delaine, PA-C  [facility] [place] 03/16/2017 1:51 PM

## 2017-03-16 NOTE — Patient Instructions (Addendum)
-   Due to the duration of your symptoms, we will treat for underlying bacteria etiology at this time. I have given you prescription for a zpack.  - I recommend you rest, drink plenty of fluids, eat light meals including soups. Use a warm mist humidifier at night time.  - You may use tessalon perles for cough suppressant.  - You may also use Tylenol or ibuprofen over-the-counter for your sore throat.  - Please let me know if you are not seeing any improvement or get worse in 7-10 days.    Cough, Adult A cough helps to clear your throat and lungs. A cough may last only 2-3 weeks (acute), or it may last longer than 8 weeks (chronic). Many different things can cause a cough. A cough may be a sign of an illness or another medical condition. Follow these instructions at home:  Pay attention to any changes in your cough.  Take medicines only as told by your doctor. ? If you were prescribed an antibiotic medicine, take it as told by your doctor. Do not stop taking it even if you start to feel better. ? Talk with your doctor before you try using a cough medicine.  Drink enough fluid to keep your pee (urine) clear or pale yellow.  If the air is dry, use a cold steam vaporizer or humidifier in your home.  Stay away from things that make you cough at work or at home.  If your cough is worse at night, try using extra pillows to raise your head up higher while you sleep.  Do not smoke, and try not to be around smoke. If you need help quitting, ask your doctor.  Do not have caffeine.  Do not drink alcohol.  Rest as needed. Contact a doctor if:  You have new problems (symptoms).  You cough up yellow fluid (pus).  Your cough does not get better after 2-3 weeks, or your cough gets worse.  Medicine does not help your cough and you are not sleeping well.  You have pain that gets worse or pain that is not helped with medicine.  You have a fever.  You are losing weight and you do not know  why.  You have night sweats. Get help right away if:  You cough up blood.  You have trouble breathing.  Your heartbeat is very fast. This information is not intended to replace advice given to you by your health care provider. Make sure you discuss any questions you have with your health care provider. Document Released: 12/26/2010 Document Revised: 09/20/2015 Document Reviewed: 06/21/2014 Elsevier Interactive Patient Education  2018 [employer].    IF you received an x-ray today, you will receive an invoice from [facility]. Please contact [facility] at [phone] with questions or concerns regarding your invoice.   IF you received labwork today, you will receive an invoice from [facility]. Please contact [facility] at [phone] with questions or concerns regarding your invoice.   Our billing staff will not be able to assist you with questions regarding bills from these companies.  You will be contacted with the lab results as soon as they are available. The fastest way to get your results is to activate your My Chart account. Instructions are located on the last page of this paperwork. If you have not heard from [place] regarding the results in 2 weeks, please contact this office.

## 2017-04-18 ENCOUNTER — Ambulatory Visit (INDEPENDENT_AMBULATORY_CARE_PROVIDER_SITE_OTHER): Payer: Medicare Other | Admitting: Family Medicine

## 2017-04-18 ENCOUNTER — Other Ambulatory Visit: Payer: Self-pay

## 2017-04-18 ENCOUNTER — Encounter: Payer: Self-pay | Admitting: Family Medicine

## 2017-04-18 VITALS — BP 158/92 | HR 65 | Temp 97.7°F | Resp 18 | Ht 60.04 in | Wt 197.0 lb

## 2017-04-18 DIAGNOSIS — E034 Atrophy of thyroid (acquired): Secondary | ICD-10-CM | POA: Diagnosis not present

## 2017-04-18 DIAGNOSIS — M810 Age-related osteoporosis without current pathological fracture: Secondary | ICD-10-CM

## 2017-04-18 DIAGNOSIS — Z23 Encounter for immunization: Secondary | ICD-10-CM | POA: Diagnosis not present

## 2017-04-18 DIAGNOSIS — E119 Type 2 diabetes mellitus without complications: Secondary | ICD-10-CM

## 2017-04-18 DIAGNOSIS — E7800 Pure hypercholesterolemia, unspecified: Secondary | ICD-10-CM

## 2017-04-18 DIAGNOSIS — I10 Essential (primary) hypertension: Secondary | ICD-10-CM | POA: Diagnosis not present

## 2017-04-18 LAB — POCT URINALYSIS DIP (MANUAL ENTRY)
BILIRUBIN UA: NEGATIVE
Blood, UA: NEGATIVE
GLUCOSE UA: NEGATIVE mg/dL
LEUKOCYTES UA: NEGATIVE
Nitrite, UA: NEGATIVE
Protein Ur, POC: >=300 mg/dL — AB
SPEC GRAV UA: 1.025 (ref 1.010–1.025)
Urobilinogen, UA: 0.2 E.U./dL
pH, UA: 6.5 (ref 5.0–8.0)

## 2017-04-18 LAB — POCT GLYCOSYLATED HEMOGLOBIN (HGB A1C): Hemoglobin A1C: 7.2

## 2017-04-18 LAB — GLUCOSE, POCT (MANUAL RESULT ENTRY): POC Glucose: 118 mg/dl — AB (ref 70–99)

## 2017-04-18 MED ORDER — ATENOLOL 100 MG PO TABS: 100.0000 mg | ORAL_TABLET | Freq: Every day | ORAL | 3 refills | Status: DC

## 2017-04-18 MED ORDER — METFORMIN HCL 1000 MG PO TABS: 1000.0000 mg | ORAL_TABLET | Freq: Two times a day (BID) | ORAL | 1 refills | Status: DC

## 2017-04-18 MED ORDER — ATORVASTATIN CALCIUM 40 MG PO TABS: 40.0000 mg | ORAL_TABLET | Freq: Every day | ORAL | 3 refills | Status: DC

## 2017-04-18 MED ORDER — AMLODIPINE BESYLATE 10 MG PO TABS: 10.0000 mg | ORAL_TABLET | Freq: Every day | ORAL | 3 refills | Status: DC

## 2017-04-18 MED ORDER — LANCETS MISC: 3 refills | Status: AC

## 2017-04-18 MED ORDER — ALENDRONATE SODIUM 70 MG PO TABS: ORAL_TABLET | ORAL | 3 refills | Status: DC

## 2017-04-18 MED ORDER — LANCETS MISC: 3 refills | Status: DC

## 2017-04-18 MED ORDER — GLUCOSE BLOOD VI STRP: ORAL_STRIP | 12 refills | Status: DC

## 2017-04-18 MED ORDER — LOSARTAN POTASSIUM 100 MG PO TABS: 100.0000 mg | ORAL_TABLET | Freq: Every day | ORAL | 3 refills | Status: DC

## 2017-04-18 MED ORDER — LEVOTHYROXINE SODIUM 75 MCG PO TABS: 75.0000 ug | ORAL_TABLET | Freq: Every day | ORAL | 3 refills | Status: DC

## 2017-04-18 NOTE — Patient Instructions (Signed)
     IF you received an x-ray today, you will receive an invoice from [facility]. Please contact [facility] at [phone] with questions or concerns regarding your invoice.   IF you received labwork today, you will receive an invoice from [facility]. Please contact [facility] at [phone] with questions or concerns regarding your invoice.   Our billing staff will not be able to assist you with questions regarding bills from these companies.  You will be contacted with the lab results as soon as they are available. The fastest way to get your results is to activate your My Chart account. Instructions are located on the last page of this paperwork. If you have not heard from us regarding the results in 2 weeks, please contact this office.     

## 2017-04-18 NOTE — Progress Notes (Signed)
Subjective:    Patient ID: Ashley Frederick, female    DOB: 05/06/1940, 76 y.o.   MRN: [id number]  04/18/2017  Medication Problem and Diabetes (f/u)    HPI This 76 y.o. female presents with husband for SEVEN MONTH FOLLOW-UP evaluation of DMII, hypertension, hypercholesterolemia, hypothyroidism. Management changes made at last visit include the following:  Sugar/glucose is elevated at 142 and hemoglobin A1c is stable at 7.0; continue current dose of diabetes medications. -Cholesterol is elevated. I recommend increasing Atorvastatin to 40mg  daily. I have sent in a new prescription.  -Thyroid is normal.   Refill of thyroid medication. Glucose supplies ran out of them.  Three month supply. Accucheck Sugars running 112 before meals; after meals 160.   Very careful with diet.  NO carbohydrates.  Fruits and vegetables.  No numbness or tingling in feet  Last eye exam May 2019.   Breakfast. Flu vaccine 02/26/2017.  Zaid #[id number]   BP Readings from Last 3 Encounters:  04/18/17 (!) 155/91  03/16/17 138/86  12/18/16 118/76   Wt Readings from Last 3 Encounters:  04/18/17 197 lb (89.4 kg)  03/16/17 194 lb 9.6 oz (88.3 kg)  12/18/16 196 lb 14.4 oz (89.3 kg)   Immunization History  Administered Date(s) Administered  . Influenza Split 02/12/2011, 02/26/2012  . Influenza Whole 01/23/2010  . Influenza-Unspecified 02/26/2017    Review of Systems  Constitutional: Negative for chills, diaphoresis, fatigue and fever.  Eyes: Negative for visual disturbance.  Respiratory: Negative for cough and shortness of breath.   Cardiovascular: Negative for chest pain, palpitations and leg swelling.  Gastrointestinal: Negative for abdominal pain, constipation, diarrhea, nausea and vomiting.  Endocrine: Negative for cold intolerance, heat intolerance, polydipsia, polyphagia and polyuria.  Genitourinary:       Nocturia x 2-3.  Neurological: Negative for dizziness, tremors, seizures, syncope,  facial asymmetry, speech difficulty, weakness, light-headedness, numbness and headaches.    Past Medical History:  Diagnosis Date  . Diabetes mellitus, type 2 ([place])   . DYSLIPIDEMIA   . HYPERTENSION   . HYPOTHYROIDISM   . OA (osteoarthritis) of knee   . OSTEOPOROSIS    Past Surgical History:  Procedure Laterality Date  . NO PAST SURGERIES    . THYROID SURGERY     Allergies  Allergen Reactions  . Hctz [Hydrochlorothiazide] Rash   Current Outpatient Medications on File Prior to Visit  Medication Sig Dispense Refill  . aspirin 81 MG chewable tablet Chew 81 mg by mouth daily.    . Cholecalciferol (VITAMIN D3) 1000 units CAPS Take by mouth.    . meloxicam (MOBIC) 7.5 MG tablet TAKE 1 TABLET TWICE A DAY WITH FOOD AS NEEDED FOR PAIN  2   No current facility-administered medications on file prior to visit.    Social History   Socioeconomic History  . Marital status: Married    Spouse name: Ayoub  . Number of children: 6  . Years of education: Not on file  . Highest education level: Not on file  Social Needs  . Financial resource strain: Not on file  . Food insecurity - worry: Not on file  . Food insecurity - inability: Not on file  . Transportation needs - medical: Not on file  . Transportation needs - non-medical: Not on file  Occupational History  . Not on file  Tobacco Use  . Smoking status: Never Smoker  . Smokeless tobacco: Never Used  . Tobacco comment: Moved to [place] to be closer to family- lives with  son  Substance and Sexual Activity  . Alcohol use: No  . Drug use: No  . Sexual activity: Not Currently  Other Topics Concern  . Not on file  Social History Narrative   ** Merged History Encounter **      Marital status: married; one in [place], one in [place].      Children: 6 children; 8 grandchildren      Lives: with son      Employment: retired.               History reviewed. No pertinent family history.     Objective:    BP (!) 155/91 (BP  Location: Right Arm, Patient Position: Sitting, Cuff Size: Large)   Pulse 64   Temp 97.7 F (36.5 C) (Oral)   Resp 18   Ht 5' 0.04" (1.525 m)   Wt 197 lb (89.4 kg)   SpO2 97%   BMI 38.42 kg/m  Physical Exam  Constitutional: She is oriented to person, place, and time. She appears well-developed and well-nourished. No distress.  HENT:  Head: Normocephalic and atraumatic.  Right Ear: External ear normal.  Left Ear: External ear normal.  Nose: Nose normal.  Mouth/Throat: Oropharynx is clear and moist.  Eyes: Conjunctivae and EOM are normal. Pupils are equal, round, and reactive to light.  Neck: Normal range of motion. Neck supple. Carotid bruit is not present. No thyromegaly present.  Cardiovascular: Normal rate, regular rhythm, normal heart sounds and intact distal pulses. Exam reveals no gallop and no friction rub.  No murmur heard. Bilateral lower extremity 2+ non-pitting edema.  Pulmonary/Chest: Effort normal and breath sounds normal. She has no wheezes. She has no rales.  Abdominal: Soft. Bowel sounds are normal. She exhibits no distension and no mass. There is no tenderness. There is no rebound and no guarding.  Musculoskeletal: She exhibits edema.  Lymphadenopathy:    She has no cervical adenopathy.  Neurological: She is alert and oriented to person, place, and time. No cranial nerve deficit.  Skin: Skin is warm and dry. No rash noted. She is not diaphoretic. No erythema. No pallor.  Psychiatric: She has a normal mood and affect. Her behavior is normal.   No results found. Depression screen [facility] 2/9 04/18/2017 03/16/2017 12/18/2016 09/16/2016 05/29/2016  Decreased Interest 0 0 0 0 0  Down, Depressed, Hopeless 0 0 0 0 -  PHQ - 2 Score 0 0 0 0 0   Fall Risk  04/18/2017 03/16/2017 12/18/2016 09/16/2016 05/29/2016  Falls in the past year? No No Yes No Yes  Number falls in past yr: - - 1 - 2 or more  Injury with Fall? - - Yes - No  Comment - - right knee - -        Assessment &  Plan:   1. Type 2 diabetes mellitus without complication, without long-term current use of insulin ([place])   2. Hypothyroidism due to acquired atrophy of thyroid   3. Essential hypertension   4. Pure hypercholesterolemia   5. Age-related osteoporosis without current pathological fracture   6. Need for prophylactic vaccination against Streptococcus pneumoniae (pneumococcus)     Diabetes mellitus type 2, hypothyroidism, hypertension, hypercholesterolemia, osteoporosis.  Controlled obtain labs for chronic disease management.  Refills on all medications provided.  Encourage weight loss, exercise, low sugar and low carbohydrate food choices.  Status post Prevnar 13 in office.  Has received influenza vaccine in November 2018.  Orders Placed This Encounter  Procedures  . Pneumococcal conjugate  vaccine 13-valent IM  . CBC with Differential/Platelet  . Comprehensive metabolic panel    Order Specific Question:   Has the patient fasted?    Answer:   No  . TSH  . Lipid panel    Order Specific Question:   Has the patient fasted?    Answer:   No  . POCT glucose (manual entry)  . POCT glycosylated hemoglobin (Hb A1C)  . POCT urinalysis dipstick   Meds ordered this encounter  Medications  . levothyroxine (SYNTHROID, LEVOTHROID) 75 MCG tablet    Sig: Take 1 tablet (75 mcg total) by mouth daily.    Dispense:  90 tablet    Refill:  3  . alendronate (FOSAMAX) 70 MG tablet    Sig: TAKE 1 TABLET EVERY 7 DAYS WITH A FULL GLASS OF WATER. DO NOT LIE DOWN FOR THE NEXT 30 MINS    Dispense:  12 tablet    Refill:  3  . amLODipine (NORVASC) 10 MG tablet    Sig: Take 1 tablet (10 mg total) by mouth daily.    Dispense:  90 tablet    Refill:  3  . atenolol (TENORMIN) 100 MG tablet    Sig: Take 1 tablet (100 mg total) by mouth daily.    Dispense:  90 tablet    Refill:  3  . atorvastatin (LIPITOR) 40 MG tablet    Sig: Take 1 tablet (40 mg total) by mouth daily at 6 PM.    Dispense:  90 tablet    Refill:   3  . losartan (COZAAR) 100 MG tablet    Sig: Take 1 tablet (100 mg total) by mouth daily.    Dispense:  90 tablet    Refill:  3  . metFORMIN (GLUCOPHAGE) 1000 MG tablet    Sig: Take 1 tablet (1,000 mg total) by mouth 2 (two) times daily with a meal.    Dispense:  180 tablet    Refill:  1  . Lancets MISC    Sig: Check sugar once daily dx DMII non-insulin no complications  ACCUCHECK    Dispense:  100 each    Refill:  3  . glucose blood test strip    Sig: Check sugar once daily dx: DMII non-insulin without complication  ACCUCHECK    Dispense:  100 each    Refill:  12    Return in about 4 months (around 08/17/2017) for complete physical examiniation.   Kristi Elayne Guerin, M.D. Primary [facility] at [facility] [address] [place], [place]  [address] [phone] phone [phone] fax

## 2017-04-19 LAB — COMPREHENSIVE METABOLIC PANEL WITH GFR
ALT: 6 IU/L (ref 0–32)
AST: 9 IU/L (ref 0–40)
Albumin/Globulin Ratio: 1.5 (ref 1.2–2.2)
Albumin: 4.4 g/dL (ref 3.5–4.8)
Alkaline Phosphatase: 83 IU/L (ref 39–117)
BUN/Creatinine Ratio: 24 (ref 12–28)
BUN: 20 mg/dL (ref 8–27)
Bilirubin Total: <0.2 mg/dL (ref 0.0–1.2)
CO2: 21 mmol/L (ref 20–29)
Calcium: 9.4 mg/dL (ref 8.7–10.3)
Chloride: 102 mmol/L (ref 96–106)
Creatinine, Ser: 0.84 mg/dL (ref 0.57–1.00)
GFR calc Af Amer: 79 mL/min/1.73 (ref >59)
GFR calc non Af Amer: 68 mL/min/1.73 (ref >59)
Globulin, Total: 3 g/dL (ref 1.5–4.5)
Glucose: 110 mg/dL — ABNORMAL HIGH (ref 65–99)
Potassium: 4.4 mmol/L (ref 3.5–5.2)
Sodium: 140 mmol/L (ref 134–144)
Total Protein: 7.4 g/dL (ref 6.0–8.5)

## 2017-04-19 LAB — CBC WITH DIFFERENTIAL/PLATELET
Basophils Absolute: 0 x10E3/uL (ref 0.0–0.2)
Basos: 0 %
EOS (ABSOLUTE): 0.2 x10E3/uL (ref 0.0–0.4)
Eos: 2 %
Hematocrit: 37.5 % (ref 34.0–46.6)
Hemoglobin: 12.3 g/dL (ref 11.1–15.9)
Immature Grans (Abs): 0 x10E3/uL (ref 0.0–0.1)
Immature Granulocytes: 0 %
Lymphocytes Absolute: 2 x10E3/uL (ref 0.7–3.1)
Lymphs: 25 %
MCH: 26.7 pg (ref 26.6–33.0)
MCHC: 32.8 g/dL (ref 31.5–35.7)
MCV: 81 fL (ref 79–97)
Monocytes Absolute: 0.5 x10E3/uL (ref 0.1–0.9)
Monocytes: 6 %
Neutrophils Absolute: 5.3 x10E3/uL (ref 1.4–7.0)
Neutrophils: 67 %
Platelets: 277 x10E3/uL (ref 150–379)
RBC: 4.61 x10E6/uL (ref 3.77–5.28)
RDW: 15.9 % — ABNORMAL HIGH (ref 12.3–15.4)
WBC: 7.9 x10E3/uL (ref 3.4–10.8)

## 2017-04-19 LAB — LIPID PANEL
Chol/HDL Ratio: 3.2 ratio (ref 0.0–4.4)
Cholesterol, Total: 137 mg/dL (ref 100–199)
HDL: 43 mg/dL (ref >39)
LDL Calculated: 59 mg/dL (ref 0–99)
Triglycerides: 174 mg/dL — ABNORMAL HIGH (ref 0–149)
VLDL Cholesterol Cal: 35 mg/dL (ref 5–40)

## 2017-04-19 LAB — TSH: TSH: 2.83 u[IU]/mL (ref 0.450–4.500)

## 2017-05-11 ENCOUNTER — Encounter: Payer: Self-pay | Admitting: Family Medicine

## 2017-05-12 ENCOUNTER — Encounter: Payer: Self-pay | Admitting: Family Medicine

## 2017-06-22 DIAGNOSIS — M810 Age-related osteoporosis without current pathological fracture: Secondary | ICD-10-CM | POA: Diagnosis not present

## 2017-06-22 DIAGNOSIS — E559 Vitamin D deficiency, unspecified: Secondary | ICD-10-CM | POA: Diagnosis not present

## 2017-06-26 DIAGNOSIS — E039 Hypothyroidism, unspecified: Secondary | ICD-10-CM | POA: Diagnosis not present

## 2017-06-26 DIAGNOSIS — E785 Hyperlipidemia, unspecified: Secondary | ICD-10-CM | POA: Diagnosis not present

## 2017-06-26 DIAGNOSIS — I10 Essential (primary) hypertension: Secondary | ICD-10-CM | POA: Diagnosis not present

## 2017-06-26 DIAGNOSIS — E7800 Pure hypercholesterolemia, unspecified: Secondary | ICD-10-CM | POA: Diagnosis not present

## 2017-06-26 DIAGNOSIS — E109 Type 1 diabetes mellitus without complications: Secondary | ICD-10-CM | POA: Diagnosis not present

## 2017-06-26 DIAGNOSIS — E559 Vitamin D deficiency, unspecified: Secondary | ICD-10-CM | POA: Diagnosis not present

## 2017-06-29 DIAGNOSIS — M810 Age-related osteoporosis without current pathological fracture: Secondary | ICD-10-CM | POA: Diagnosis not present

## 2017-07-02 DIAGNOSIS — E039 Hypothyroidism, unspecified: Secondary | ICD-10-CM | POA: Diagnosis not present

## 2017-07-02 DIAGNOSIS — I10 Essential (primary) hypertension: Secondary | ICD-10-CM | POA: Diagnosis not present

## 2017-07-02 DIAGNOSIS — E109 Type 1 diabetes mellitus without complications: Secondary | ICD-10-CM | POA: Diagnosis not present

## 2017-07-02 DIAGNOSIS — E7800 Pure hypercholesterolemia, unspecified: Secondary | ICD-10-CM | POA: Diagnosis not present

## 2017-07-13 DIAGNOSIS — M84375D Stress fracture, left foot, subsequent encounter for fracture with routine healing: Secondary | ICD-10-CM | POA: Diagnosis not present

## 2017-07-13 DIAGNOSIS — Z1231 Encounter for screening mammogram for malignant neoplasm of breast: Secondary | ICD-10-CM | POA: Diagnosis not present

## 2017-07-13 DIAGNOSIS — M25561 Pain in right knee: Secondary | ICD-10-CM | POA: Diagnosis not present

## 2017-07-17 DIAGNOSIS — Z0000 Encounter for general adult medical examination without abnormal findings: Secondary | ICD-10-CM | POA: Diagnosis not present

## 2017-07-24 DIAGNOSIS — J069 Acute upper respiratory infection, unspecified: Secondary | ICD-10-CM | POA: Diagnosis not present

## 2017-07-27 DIAGNOSIS — M1711 Unilateral primary osteoarthritis, right knee: Secondary | ICD-10-CM | POA: Diagnosis not present

## 2017-08-10 DIAGNOSIS — M1711 Unilateral primary osteoarthritis, right knee: Secondary | ICD-10-CM | POA: Diagnosis not present

## 2017-08-17 DIAGNOSIS — M1711 Unilateral primary osteoarthritis, right knee: Secondary | ICD-10-CM | POA: Diagnosis not present

## 2017-09-23 ENCOUNTER — Encounter: Payer: Self-pay | Admitting: Family Medicine

## 2017-12-10 ENCOUNTER — Encounter: Payer: Self-pay | Admitting: Family Medicine

## 2017-12-10 ENCOUNTER — Ambulatory Visit (INDEPENDENT_AMBULATORY_CARE_PROVIDER_SITE_OTHER): Payer: Medicare Other | Admitting: Family Medicine

## 2017-12-10 ENCOUNTER — Other Ambulatory Visit: Payer: Self-pay

## 2017-12-10 VITALS — BP 141/83 | HR 60 | Temp 99.0°F | Resp 16 | Ht 60.04 in | Wt 188.8 lb

## 2017-12-10 DIAGNOSIS — R609 Edema, unspecified: Secondary | ICD-10-CM

## 2017-12-10 DIAGNOSIS — R600 Localized edema: Secondary | ICD-10-CM | POA: Diagnosis not present

## 2017-12-10 DIAGNOSIS — E7800 Pure hypercholesterolemia, unspecified: Secondary | ICD-10-CM | POA: Diagnosis not present

## 2017-12-10 DIAGNOSIS — R601 Generalized edema: Secondary | ICD-10-CM | POA: Diagnosis not present

## 2017-12-10 DIAGNOSIS — I10 Essential (primary) hypertension: Secondary | ICD-10-CM | POA: Diagnosis not present

## 2017-12-10 DIAGNOSIS — E034 Atrophy of thyroid (acquired): Secondary | ICD-10-CM

## 2017-12-10 DIAGNOSIS — E119 Type 2 diabetes mellitus without complications: Secondary | ICD-10-CM | POA: Diagnosis not present

## 2017-12-10 LAB — POCT GLYCOSYLATED HEMOGLOBIN (HGB A1C): Hemoglobin A1C: 7.5 % — AB (ref 4.0–5.6)

## 2017-12-10 MED ORDER — HYDROCORTISONE 2.5 % EX CREA: TOPICAL_CREAM | Freq: Two times a day (BID) | CUTANEOUS | 0 refills | Status: AC

## 2017-12-10 MED ORDER — METFORMIN HCL 1000 MG PO TABS: 1000.0000 mg | ORAL_TABLET | Freq: Two times a day (BID) | ORAL | 1 refills | Status: DC

## 2017-12-10 MED ORDER — AMLODIPINE BESYLATE 10 MG PO TABS: 10.0000 mg | ORAL_TABLET | Freq: Every day | ORAL | 3 refills | Status: DC

## 2017-12-10 MED ORDER — MELOXICAM 7.5 MG PO TABS: ORAL_TABLET | ORAL | 2 refills | Status: DC

## 2017-12-10 MED ORDER — CETIRIZINE HCL 10 MG PO TABS: 10.0000 mg | ORAL_TABLET | Freq: Every day | ORAL | 11 refills | Status: AC

## 2017-12-10 MED ORDER — LEVOTHYROXINE SODIUM 75 MCG PO TABS: 75.0000 ug | ORAL_TABLET | Freq: Every day | ORAL | 3 refills | Status: DC

## 2017-12-10 MED ORDER — VITAMIN D3 25 MCG (1000 UT) PO CAPS: 1.0000 | ORAL_CAPSULE | Freq: Two times a day (BID) | ORAL | 11 refills | Status: AC

## 2017-12-10 MED ORDER — ATORVASTATIN CALCIUM 40 MG PO TABS: 40.0000 mg | ORAL_TABLET | Freq: Every day | ORAL | 3 refills | Status: DC

## 2017-12-10 MED ORDER — ALENDRONATE SODIUM 70 MG PO TABS: ORAL_TABLET | ORAL | 3 refills | Status: DC

## 2017-12-10 MED ORDER — ATENOLOL 100 MG PO TABS: 100.0000 mg | ORAL_TABLET | Freq: Every day | ORAL | 3 refills | Status: DC

## 2017-12-10 MED ORDER — ASPIRIN 81 MG PO CHEW: 81.0000 mg | CHEWABLE_TABLET | Freq: Every day | ORAL | 3 refills | Status: AC

## 2017-12-10 MED ORDER — LOSARTAN POTASSIUM 100 MG PO TABS: 100.0000 mg | ORAL_TABLET | Freq: Every day | ORAL | 3 refills | Status: DC

## 2017-12-10 NOTE — Patient Instructions (Addendum)
For rash -  I have sent in claritin to the pharmacy to help with itching You can also apply hydrocortisone 2.5% to the itchy areas   For the swelling in the legs -  Use compression stocking when standing I will review the labs and send in a water pill next week if all the labs are normal   IF you received an x-ray today, you will receive an invoice from [facility]. Please contact [facility] at [phone] with questions or concerns regarding your invoice.   IF you received labwork today, you will receive an invoice from [facility]. Please contact [facility] at [phone] with questions or concerns regarding your invoice.   Our billing staff will not be able to assist you with questions regarding bills from these companies.  You will be contacted with the lab results as soon as they are available. The fastest way to get your results is to activate your My Chart account. Instructions are located on the last page of this paperwork. If you have not heard from [place] regarding the results in 2 weeks, please contact this office.     Peripheral Edema Peripheral edema is swelling that is caused by a buildup of fluid. Peripheral edema most often affects the lower legs, ankles, and feet. It can also develop in the arms, hands, and face. The area of the body that has peripheral edema will look swollen. It may also feel heavy or warm. Your clothes may start to feel tight. Pressing on the area may make a temporary dent in your skin. You may not be able to move your arm or leg as much as usual. There are many causes of peripheral edema. It can be a complication of other diseases, such as congestive heart failure, kidney disease, or a problem with your blood circulation. It also can be a side effect of certain medicines. It often happens to women during pregnancy. Sometimes, the cause is not known. Treating the underlying condition is often the only treatment for peripheral edema. Follow these  instructions at home: Pay attention to any changes in your symptoms. Take these actions to help with your discomfort:  Raise (elevate) your legs while you are sitting or lying down.  Move around often to prevent stiffness and to lessen swelling. Do not sit or stand for long periods of time.  Wear support stockings as told by your health care provider.  Follow instructions from your health care provider about limiting salt (sodium) in your diet. Sometimes eating less salt can reduce swelling.  Take over-the-counter and prescription medicines only as told by your health care provider. Your health care provider may prescribe medicine to help your body get rid of excess water (diuretic).  Keep all follow-up visits as told by your health care provider. This is important.  Contact a health care provider if:  You have a fever.  Your edema starts suddenly or is getting worse, especially if you are pregnant or have a medical condition.  You have swelling in only one leg.  You have increased swelling and pain in your legs. Get help right away if:  You develop shortness of breath, especially when you are lying down.  You have pain in your chest or abdomen.  You feel weak.  You faint. This information is not intended to replace advice given to you by your health care provider. Make sure you discuss any questions you have with your health care provider. Document Released: 05/22/2004 Document Revised: 09/17/2015 Document Reviewed: 10/25/2014  Chartered certified accountant Patient Education  [employer].

## 2017-12-10 NOTE — Progress Notes (Signed)
Chief Complaint  Patient presents with  . lab work    per pt she gets labs done every 3 months.  Pt c/o swelling in legs/feet after standing for 15 minutes to do anything and feet swell to where she can't wear her shoes and rash on both arms /legs and stomach x yesterday.  No otc creams used to help with symptoms    HPI  Rash Pt reports itchy rash on both arms and in the crease of her abd She report she had an exterminator for the insects that sprayed for bedbugs She states that [place] and [place] to the house so she was cleaning   Hypertension  Hypertension: Patient here for follow-up of elevated blood pressure. She is not exercising and is adherent to low salt diet.  Blood pressure is well controlled at home. Cardiac symptoms none. Patient denies chest pain, chest pressure/discomfort, claudication, dyspnea, fatigue, irregular heart beat and lower extremity edema.  Cardiovascular risk factors: diabetes mellitus, hypertension, obesity (BMI >= 30 kg/m2) and sedentary lifestyle. Use of agents associated with hypertension: none. History of target organ damage: none. Severe swelling of both feet has ben occurring for months.  BP Readings from Last 3 Encounters:  12/10/17 (!) 141/83  04/18/17 (!) 158/92  03/16/17 138/86    Hypothyroidism She is compliant with her meds Lab Results  Component Value Date   TSH 0.572 12/10/2017   Denies fatigue, hot/cold intolerance, bowel changes or skin changes.  She takes her meds on an empty stomach  Hypercholesterolemia Reports that she has been eating well and does not eat a lot of meat or fried foods She takes her medications Does not take meds that increase lipid levels  Past Medical History:  Diagnosis Date  . Diabetes mellitus, type 2 ([place])   . DYSLIPIDEMIA   . HYPERTENSION   . HYPOTHYROIDISM   . OA (osteoarthritis) of knee   . OSTEOPOROSIS     Current Outpatient Medications  Medication Sig Dispense Refill  .  alendronate (FOSAMAX) 70 MG tablet TAKE 1 TABLET EVERY 7 DAYS WITH A FULL GLASS OF WATER. DO NOT LIE DOWN FOR THE NEXT 30 MINS 12 tablet 3  . amLODipine (NORVASC) 10 MG tablet Take 1 tablet (10 mg total) by mouth daily. 90 tablet 3  . aspirin 81 MG chewable tablet Chew 81 mg by mouth daily.    Marland Kitchen atenolol (TENORMIN) 100 MG tablet Take 1 tablet (100 mg total) by mouth daily. 90 tablet 3  . atorvastatin (LIPITOR) 40 MG tablet Take 1 tablet (40 mg total) by mouth daily at 6 PM. 90 tablet 3  . Cholecalciferol (VITAMIN D3) 1000 units CAPS Take by mouth.    Marland Kitchen glucose blood test strip Check sugar once daily dx: DMII non-insulin without complication  ACCUCHECK 440 each 12  . Lancets MISC Check sugar once daily dx DMII non-insulin no complications  ACCUCHECK 100 each 3  . levothyroxine (SYNTHROID, LEVOTHROID) 75 MCG tablet Take 1 tablet (75 mcg total) by mouth daily. 90 tablet 3  . losartan (COZAAR) 100 MG tablet Take 1 tablet (100 mg total) by mouth daily. 90 tablet 3  . meloxicam (MOBIC) 7.5 MG tablet TAKE 1 TABLET TWICE A DAY WITH FOOD AS NEEDED FOR PAIN  2  . metFORMIN (GLUCOPHAGE) 1000 MG tablet Take 1 tablet (1,000 mg total) by mouth 2 (two) times daily with a meal. 180 tablet 1   No current facility-administered medications for this visit.  Allergies:  Allergies  Allergen Reactions  . Hctz [Hydrochlorothiazide] Rash    Past Surgical History:  Procedure Laterality Date  . NO PAST SURGERIES    . THYROID SURGERY      Social History   Socioeconomic History  . Marital status: Married    Spouse name: Ayoub  . Number of children: 6  . Years of education: Not on file  . Highest education level: Not on file  Occupational History  . Not on file  Social Needs  . Financial resource strain: Not on file  . Food insecurity:    Worry: Not on file    Inability: Not on file  . Transportation needs:    Medical: Not on file    Non-medical: Not on file  Tobacco Use  . Smoking status: Never  Smoker  . Smokeless tobacco: Never Used  . Tobacco comment: Moved to [place] to be closer to family- lives with son  Substance and Sexual Activity  . Alcohol use: No  . Drug use: No  . Sexual activity: Not Currently  Lifestyle  . Physical activity:    Days per week: Not on file    Minutes per session: Not on file  . Stress: Not on file  Relationships  . Social connections:    Talks on phone: Not on file    Gets together: Not on file    Attends religious service: Not on file    Active member of club or organization: Not on file    Attends meetings of clubs or organizations: Not on file    Relationship status: Not on file  Other Topics Concern  . Not on file  Social History Narrative   ** Merged History Encounter **      Marital status: married yet separated from husband      Children: 6 children; 8 grandchildren      Lives: with son; husband lives in [place] yet separately.      Employment: retired.                No family history on file.   ROS Review of Systems See HPI Constitution: No fevers or chills No malaise No diaphoresis Skin: No blisters, no exposure to ivy or oak Eyes: no blurry vision, no double vision GU: no dysuria or hematuria Neuro: no dizziness or headaches all others reviewed and negative   Objective: Vitals:   12/10/17 1201  BP: (!) 141/83  Pulse: 60  Resp: 16  Temp: 99 F (37.2 C)  TempSrc: Oral  SpO2: 96%  Weight: 188 lb 12.8 oz (85.6 kg)  Height: 5' 0.04" (1.525 m)    Physical Exam  Constitutional: She is oriented to person, place, and time. She appears well-developed and well-nourished.  HENT:  Head: Normocephalic and atraumatic.  Eyes: Conjunctivae and EOM are normal.  Cardiovascular: Normal rate, regular rhythm and normal heart sounds.  No murmur heard. Pulmonary/Chest: Effort normal and breath sounds normal. No stridor. No respiratory distress. She has no wheezes.  Abdominal: Soft. Bowel sounds are normal. She  exhibits no distension. There is no tenderness. There is no guarding.  Neurological: She is alert and oriented to person, place, and time.  Skin: Skin is warm. Rash noted. There is erythema.  Psychiatric: She has a normal mood and affect. Her behavior is normal. Judgment and thought content normal.    Assessment and Plan Almyra Free was seen today for lab work.  Diagnoses and all orders for this visit:  Peripheral edema-  Discussed that compression stocking for cleaning If BNP is normal and since she has no signs of heart failure will use lasix as needed for edema Discussed with patient that this will be called in to the pharmacy and she will be instructed how to take -     Comprehensive metabolic panel -     CBC -     Brain natriuretic peptide  Type 2 diabetes mellitus without complication, without long-term current use of insulin ([place])- continue current doses Her diabetes is at goal She is compliant with her medications -     POCT glycosylated hemoglobin (Hb A1C)  Essential hypertension- bp is at goal for bp, cpm, DASH diet advised   Hypothyroidism due to acquired atrophy of thyroid -     TSH  Pure hypercholesterolemia- cpm, last levels  -     Lipid panel     Margrett Kalb A Nolon Rod

## 2017-12-11 LAB — COMPREHENSIVE METABOLIC PANEL
ALBUMIN: 3.9 g/dL (ref 3.5–4.8)
ALK PHOS: 66 IU/L (ref 39–117)
ALT: 15 IU/L (ref 0–32)
AST: 14 IU/L (ref 0–40)
Albumin/Globulin Ratio: 1.6 (ref 1.2–2.2)
BUN / CREAT RATIO: 33 — AB (ref 12–28)
BUN: 27 mg/dL (ref 8–27)
Bilirubin Total: <0.2 mg/dL (ref 0.0–1.2)
CO2: 23 mmol/L (ref 20–29)
Calcium: 8.8 mg/dL (ref 8.7–10.3)
Chloride: 101 mmol/L (ref 96–106)
Creatinine, Ser: 0.81 mg/dL (ref 0.57–1.00)
GFR calc non Af Amer: 71 mL/min/{1.73_m2} (ref >59)
GFR, EST AFRICAN AMERICAN: 82 mL/min/{1.73_m2} (ref >59)
GLOBULIN, TOTAL: 2.4 g/dL (ref 1.5–4.5)
Glucose: 120 mg/dL — ABNORMAL HIGH (ref 65–99)
Potassium: 4.8 mmol/L (ref 3.5–5.2)
SODIUM: 140 mmol/L (ref 134–144)
TOTAL PROTEIN: 6.3 g/dL (ref 6.0–8.5)

## 2017-12-11 LAB — CBC
Hematocrit: 37.3 % (ref 34.0–46.6)
Hemoglobin: 11.7 g/dL (ref 11.1–15.9)
MCH: 26.5 pg — ABNORMAL LOW (ref 26.6–33.0)
MCHC: 31.4 g/dL — ABNORMAL LOW (ref 31.5–35.7)
MCV: 84 fL (ref 79–97)
PLATELETS: 239 10*3/uL (ref 150–450)
RBC: 4.42 x10E6/uL (ref 3.77–5.28)
RDW: 16.1 % — AB (ref 12.3–15.4)
WBC: 8.5 10*3/uL (ref 3.4–10.8)

## 2017-12-11 LAB — LIPID PANEL
CHOLESTEROL TOTAL: 162 mg/dL (ref 100–199)
Chol/HDL Ratio: 2.1 ratio (ref 0.0–4.4)
HDL: 78 mg/dL (ref >39)
LDL Calculated: 64 mg/dL (ref 0–99)
Triglycerides: 101 mg/dL (ref 0–149)
VLDL CHOLESTEROL CAL: 20 mg/dL (ref 5–40)

## 2017-12-11 LAB — BRAIN NATRIURETIC PEPTIDE: BNP: 194.7 pg/mL — ABNORMAL HIGH (ref 0.0–100.0)

## 2017-12-11 LAB — TSH: TSH: 0.572 u[IU]/mL (ref 0.450–4.500)

## 2017-12-21 ENCOUNTER — Encounter: Payer: Self-pay | Admitting: *Deleted

## 2017-12-31 ENCOUNTER — Telehealth: Payer: Self-pay | Admitting: Family Medicine

## 2017-12-31 NOTE — Telephone Encounter (Signed)
Please see note below. 

## 2017-12-31 NOTE — Telephone Encounter (Signed)
Patient wants a referral to [facility] Almedia Balls, MD for right shoulder pain.  Call son at [phone] when ready to schedule due to language barrier.

## 2018-01-04 NOTE — Telephone Encounter (Signed)
We have not seen the patient for shoulder problems - if her insurance requires a referral she will need to be seen

## 2018-01-12 DIAGNOSIS — M25511 Pain in right shoulder: Secondary | ICD-10-CM | POA: Diagnosis not present

## 2018-01-12 DIAGNOSIS — M7551 Bursitis of right shoulder: Secondary | ICD-10-CM | POA: Diagnosis not present

## 2018-01-27 ENCOUNTER — Other Ambulatory Visit: Payer: Self-pay

## 2018-01-27 ENCOUNTER — Telehealth: Payer: Self-pay | Admitting: General Practice

## 2018-01-27 ENCOUNTER — Encounter: Payer: Self-pay | Admitting: Family Medicine

## 2018-01-27 ENCOUNTER — Ambulatory Visit (INDEPENDENT_AMBULATORY_CARE_PROVIDER_SITE_OTHER): Payer: Medicare Other | Admitting: Family Medicine

## 2018-01-27 VITALS — BP 157/82 | HR 56 | Temp 97.9°F | Resp 16 | Ht 60.04 in | Wt 182.0 lb

## 2018-01-27 DIAGNOSIS — Z23 Encounter for immunization: Secondary | ICD-10-CM

## 2018-01-27 DIAGNOSIS — M810 Age-related osteoporosis without current pathological fracture: Secondary | ICD-10-CM | POA: Diagnosis not present

## 2018-01-27 DIAGNOSIS — Z7983 Long term (current) use of bisphosphonates: Secondary | ICD-10-CM | POA: Diagnosis not present

## 2018-01-27 NOTE — Telephone Encounter (Signed)
Copied from [place] [id number]. Topic: General - Other >> Jan 27, 2018 11:49 AM Keene Breath wrote: Reason for CRM: [place] with [facility] called regarding an order for patient to have a bone density test.  She wanted to inform doctor that patient had a test at the [facility] in [place] and they generally prefer any future tests to be done at the same facility for comparison.  The breast center cannot compare the previous test.  Please advise.  CB# [phone], [phone]. [phone].

## 2018-01-27 NOTE — Patient Instructions (Addendum)
[facility] is located at the address [address] in [place], [place]. They can be contacted via phone at [phone] for pricing, hours and directions.    If you have lab work done today you will be contacted with your lab results within the next 2 weeks.  If you have not heard from [place] then please contact us. The fastest way to get your results is to register for My Chart.   IF you received an x-ray today, you will receive an invoice from [facility]. Please contact [facility] at [phone] with questions or concerns regarding your invoice.   IF you received labwork today, you will receive an invoice from [facility]. Please contact [facility] at [phone] with questions or concerns regarding your invoice.   Our billing staff will not be able to assist you with questions regarding bills from these companies.  You will be contacted with the lab results as soon as they are available. The fastest way to get your results is to activate your My Chart account. Instructions are located on the last page of this paperwork. If you have not heard from [place] regarding the results in 2 weeks, please contact this office.    Bone Densitometry Bone densitometry is an imaging test that uses a special X-ray to measure the amount of calcium and other minerals in your bones (bone density). This test is also known as a bone mineral density test or dual-energy X-ray absorptiometry (DXA). The test can measure bone density at your hip and your spine. It is similar to having a regular X-ray. You may have this test to:  Diagnose a condition that causes weak or thin bones (osteoporosis).  Predict your risk of a broken bone (fracture).  Determine how well osteoporosis treatment is working.  Tell a health care provider about:  Any allergies you have.  All medicines you are taking, including vitamins, herbs, eye drops, creams, and  over-the-counter medicines.  Any problems you or family members have had with anesthetic medicines.  Any blood disorders you have.  Any surgeries you have had.  Any medical conditions you have.  Possibility of pregnancy.  Any other medical test you had within the previous 14 days that used contrast material. What are the risks? Generally, this is a safe procedure. However, problems can occur and may include the following:  This test exposes you to a very small amount of radiation.  The risks of radiation exposure may be greater to unborn children.  What happens before the procedure?  Do not take any calcium supplements for 24 hours before having the test. You can otherwise eat and drink what you usually do.  Take off all metal jewelry, eyeglasses, dental appliances, and any other metal objects. What happens during the procedure?  You may lie on an exam table. There will be an X-ray generator below you and an imaging device above you.  Other devices, such as boxes or braces, may be used to position your body properly for the scan.  You will need to lie still while the machine slowly scans your body.  The images will show up on a computer monitor. What happens after the procedure? You may need more testing at a later time. This information is not intended to replace advice given to you by your health care provider. Make sure you discuss any questions you have with your health care provider. Document Released: 05/06/2004 Document Revised: 09/20/2015 Document Reviewed: 09/22/2013  Chartered certified accountant Patient Education  [employer].

## 2018-01-27 NOTE — Telephone Encounter (Signed)
Message sent to Dr. Nolon Rod re: Dexa scan

## 2018-01-27 NOTE — Progress Notes (Signed)
Chief Complaint  Patient presents with  . 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 Gabriel Carina 07/28/2014: She had a DEXA scan on 12/05/13 at [facility] and the report was reviewed. It showed lumbar spine T-score of -3.8, left total femur T-sore of -3.3.    Lab Results  Component Value Date   TSH 0.572 12/10/2017      Past Medical History:  Diagnosis Date  . Diabetes mellitus, type 2 ([place])   . DYSLIPIDEMIA   . HYPERTENSION   . HYPOTHYROIDISM   . OA (osteoarthritis) of knee   . OSTEOPOROSIS     Current Outpatient Medications  Medication Sig Dispense Refill  . alendronate (FOSAMAX) 70 MG tablet TAKE 1 TABLET EVERY 7 DAYS WITH A FULL GLASS OF WATER. DO NOT LIE DOWN FOR THE NEXT 30 MINS 12 tablet 3  . amLODipine (NORVASC) 10 MG tablet Take 1 tablet (10 mg total) by mouth daily. 90 tablet 3  . aspirin 81 MG chewable tablet Chew 1 tablet (81 mg total) by mouth daily. 90 tablet 3  . atenolol (TENORMIN) 100 MG tablet Take 1 tablet (100 mg total) by mouth daily. 90 tablet 3  . atorvastatin (LIPITOR) 40 MG tablet Take 1 tablet (40 mg total) by mouth daily at 6 PM. 90 tablet 3  . cetirizine (ZYRTEC) 10 MG tablet Take 1 tablet (10 mg total) by mouth daily. For itching and rash 30 tablet 11  . Cholecalciferol (VITAMIN D3) 1000 units CAPS Take 1 capsule (1,000 Units total) by mouth 2 (two) times daily. 60 capsule 11  . glucose blood test strip Check sugar once daily dx: DMII non-insulin without complication  ACCUCHECK 213 each 12  . hydrocortisone 2.5 % cream Apply topically 2 (two) times daily. For itching 30 g 0  . Lancets MISC Check sugar once daily dx DMII non-insulin no complications  ACCUCHECK 100 each 3  . levothyroxine (SYNTHROID, LEVOTHROID) 75 MCG tablet Take 1 tablet (75 mcg total) by mouth daily. 90 tablet 3  . losartan (COZAAR) 100 MG  tablet Take 1 tablet (100 mg total) by mouth daily. 90 tablet 3  . meloxicam (MOBIC) 7.5 MG tablet TAKE 1 TABLET TWICE A DAY WITH FOOD AS NEEDED FOR PAIN 60 tablet 2  . metFORMIN (GLUCOPHAGE) 1000 MG tablet Take 1 tablet (1,000 mg total) by mouth 2 (two) times daily with a meal. 180 tablet 1   No current facility-administered medications for this visit.     Allergies:  Allergies  Allergen Reactions  . Hctz [Hydrochlorothiazide] Rash    Past Surgical History:  Procedure Laterality Date  . NO PAST SURGERIES    . THYROID SURGERY      Social History   Socioeconomic History  . Marital status: Married    Spouse name: Ayoub  . Number of children: 6  . Years of education: Not on file  . Highest education level: Not on file  Occupational History  . Not on file  Social Needs  . Financial resource strain: Not on file  . Food insecurity:    Worry: Not on file    Inability: Not on file  . Transportation needs:    Medical: Not on file    Non-medical: Not on file  Tobacco Use  . Smoking status: Never Smoker  . Smokeless tobacco: Never Used  . Tobacco comment:  Moved to [place] to be closer to family- lives with son  Substance and Sexual Activity  . Alcohol use: No  . Drug use: No  . Sexual activity: Not Currently  Lifestyle  . Physical activity:    Days per week: Not on file    Minutes per session: Not on file  . Stress: Not on file  Relationships  . Social connections:    Talks on phone: Not on file    Gets together: Not on file    Attends religious service: Not on file    Active member of club or organization: Not on file    Attends meetings of clubs or organizations: Not on file    Relationship status: Not on file  Other Topics Concern  . Not on file  Social History Narrative   ** Merged History Encounter **      Marital status: married yet separated from husband      Children: 6 children; 8 grandchildren      Lives: with son; husband lives in [place] yet  separately.      Employment: retired.                No family history on file.   ROS Review of Systems See HPI Constitution: No fevers or chills No malaise No diaphoresis Skin: No rash or itching Eyes: no blurry vision, no double vision GU: no dysuria or hematuria Neuro: no dizziness or headaches all others reviewed and negative   Objective: Vitals:   01/27/18 0947  BP: (!) 157/82  Pulse: (!) 56  Resp: 16  Temp: 97.9 F (36.6 C)  TempSrc: Oral  SpO2: 96%  Weight: 182 lb (82.6 kg)  Height: 5' 0.04" (1.525 m)    Physical Exam  Constitutional: She appears well-developed and well-nourished.  HENT:  Head: Normocephalic and atraumatic.  Eyes: Conjunctivae and EOM are normal.  Neck: Normal range of motion. Neck supple.  Cardiovascular: Normal rate, regular rhythm and normal heart sounds.  No murmur heard. Pulmonary/Chest: Effort normal and breath sounds normal. No stridor. No respiratory distress. She has no wheezes.  Musculoskeletal: She exhibits no edema or tenderness.  Psychiatric: She has a normal mood and affect. Her behavior is normal. Judgment and thought content normal.       REFERRING MD: [facility] TECHNICIAN:Lori ARobinson HISTORY:BASELINE STUDY. 77 YEAR OLD INDIAN FEMALE. S/P RIGHT LEG  FRACTURE.  SITE DATE BMD g/cm2  T-SCORE Z-SCORE g/cm2 CHANGE % CHANGESTATISTICALLY SIGNIFICANT?  LUMBAR SPINE L1- L4   HIP L.FEM.NECK 03/18/16 0.505 -3.1     L.TOTAL HIP 03/18/16 0.678 -2.2    FOREARM L/R 33% 03/18/16 0.517 -2.9     INTERPRETATION:Osteoporosis.Lumbar spine not reported due to scoliosis  or sclerosis.    FRACTURE RISK ASSESSMENT (FRAX) __5.0___10 year risk of hip fracture.__16___10 year risk of any  major fracture.   TREATMENT:  The National Osteoporosis Foundation (2008) recommends treatment for:   Patients with hip or vertebral fracture (clinical or morphometric) Patients with osteoporosis as defined by T score < = -2.5 Postmenopausal women or men age 51 and older with low bone mass (T score  -1.0 to -2.5, osteopenia) at the femoral neck, total hip, or spine and 10  year hip fracture risk probability >3% or a 10 year all major osteoporosis  related fracture probability of >20%. BMD should be monitored two years after initiating therapy and at two-year  intervals thereafter.  _______________________________________ A. Lavone Orn, MD  [facility] DXA is a  Hologic QDR 4500. [facility] facility and technologist Least Significant Change [facility]): TechnologistSite LS Spine Site Hip L R0.054 g/cm2 0.033 g/cm2 at 95% confidence level    Assessment and Plan Almyra Free was seen today for medication refill.  Diagnoses and all orders for this visit:  Age-related osteoporosis without current pathological fracture- pt already on fosamax for 3 years Discussed that typically 5 year course then drug holiday -     DG Bone Density; Future  Long term use of alendronate therapy (Fosamax) -     DG Bone Density; Future  Flu vaccine need -     Flu vaccine HIGH DOSE PF (Fluzone High dose)     Ashley Frederick

## 2018-02-04 NOTE — Telephone Encounter (Signed)
LMOVM at [facility] for them to call pt and schedule per Dr. Nolon Rod' note. Req they get results from [place] prior to pt being scheduled.

## 2018-02-04 NOTE — Telephone Encounter (Signed)
I asked the patient her preference and told her that it is best to go to the same place but she said she does not want to travel that far. Please proceed locally.

## 2018-02-19 ENCOUNTER — Ambulatory Visit
Admission: RE | Admit: 2018-02-19 | Discharge: 2018-02-19 | Disposition: A | Discharge status: Home / Self Care | Payer: Medicare Other | Source: Ambulatory Visit | Attending: Family Medicine | Admitting: Family Medicine

## 2018-02-19 DIAGNOSIS — M810 Age-related osteoporosis without current pathological fracture: Secondary | ICD-10-CM

## 2018-02-19 DIAGNOSIS — Z780 Asymptomatic menopausal state: Secondary | ICD-10-CM | POA: Diagnosis not present

## 2018-02-19 DIAGNOSIS — Z7983 Long term (current) use of bisphosphonates: Secondary | ICD-10-CM

## 2018-02-26 ENCOUNTER — Ambulatory Visit (INDEPENDENT_AMBULATORY_CARE_PROVIDER_SITE_OTHER): Payer: Medicare Other | Admitting: Family Medicine

## 2018-02-26 ENCOUNTER — Encounter: Payer: Self-pay | Admitting: Family Medicine

## 2018-02-26 VITALS — BP 138/72 | HR 59 | Temp 97.9°F | Resp 16 | Ht 60.05 in | Wt 182.0 lb

## 2018-02-26 DIAGNOSIS — M8949 Other hypertrophic osteoarthropathy, multiple sites: Secondary | ICD-10-CM

## 2018-02-26 DIAGNOSIS — M810 Age-related osteoporosis without current pathological fracture: Secondary | ICD-10-CM | POA: Diagnosis not present

## 2018-02-26 DIAGNOSIS — M159 Polyosteoarthritis, unspecified: Secondary | ICD-10-CM

## 2018-02-26 DIAGNOSIS — M150 Primary generalized (osteo)arthritis: Secondary | ICD-10-CM | POA: Diagnosis not present

## 2018-02-26 MED ORDER — MELOXICAM 7.5 MG PO TABS: ORAL_TABLET | ORAL | 2 refills | Status: AC

## 2018-02-26 MED ORDER — DICLOFENAC SODIUM 1 % TD GEL: 2.0000 g | Freq: Four times a day (QID) | TRANSDERMAL | 11 refills | Status: AC

## 2018-02-26 NOTE — Patient Instructions (Signed)
° ° ° °  If you have lab work done today you will be contacted with your lab results within the next 2 weeks.  If you have not heard from us then please contact us. The fastest way to get your results is to register for My Chart. ° ° °IF you received an x-ray today, you will receive an invoice from [facility]. Please contact  Radiology at [phone] with questions or concerns regarding your invoice.  ° °IF you received labwork today, you will receive an invoice from [facility]. Please contact [facility] at [phone] with questions or concerns regarding your invoice.  ° °Our billing staff will not be able to assist you with questions regarding bills from these companies. ° °You will be contacted with the lab results as soon as they are available. The fastest way to get your results is to activate your My Chart account. Instructions are located on the last page of this paperwork. If you have not heard from us regarding the results in 2 weeks, please contact this office. °  ° ° ° °

## 2018-02-26 NOTE — Progress Notes (Signed)
Chief Complaint  Patient presents with  . Back Pain  . Arm Pain    right arm 3 months    HPI  Translator [id number] Patient reports that she has been having pain in the   She went to [facility] for a corticosteroid injection in to the shoulder in September 2019 She reports that it did not help at all She reports that she continued tohave pain She was given the injection  She had an imaging study done at [facility] She reports that she takes meloxicam 7.5mg  at bedtime for her pain  She would rate her pain as 6/10  She is only taking meloxicam 7.5mg  at bedtime  Her bone density done 02/19/2018 which showed osteoporosis and DJD of lumbar spine She has chronic low back pain     Past Medical History:  Diagnosis Date  . Diabetes mellitus, type 2 ([place])   . DYSLIPIDEMIA   . HYPERTENSION   . HYPOTHYROIDISM   . OA (osteoarthritis) of knee   . OSTEOPOROSIS     Current Outpatient Medications  Medication Sig Dispense Refill  . alendronate (FOSAMAX) 70 MG tablet TAKE 1 TABLET EVERY 7 DAYS WITH A FULL GLASS OF WATER. DO NOT LIE DOWN FOR THE NEXT 30 MINS 12 tablet 3  . amLODipine (NORVASC) 10 MG tablet Take 1 tablet (10 mg total) by mouth daily. 90 tablet 3  . aspirin 81 MG chewable tablet Chew 1 tablet (81 mg total) by mouth daily. 90 tablet 3  . atenolol (TENORMIN) 100 MG tablet Take 1 tablet (100 mg total) by mouth daily. 90 tablet 3  . atorvastatin (LIPITOR) 40 MG tablet Take 1 tablet (40 mg total) by mouth daily at 6 PM. 90 tablet 3  . cetirizine (ZYRTEC) 10 MG tablet Take 1 tablet (10 mg total) by mouth daily. For itching and rash 30 tablet 11  . Cholecalciferol (VITAMIN D3) 1000 units CAPS Take 1 capsule (1,000 Units total) by mouth 2 (two) times daily. 60 capsule 11  . diclofenac sodium (VOLTAREN) 1 % GEL Apply 2 g topically 4 (four) times daily. 100 g 11  . glucose blood test strip Check sugar once daily dx: DMII non-insulin without complication  ACCUCHECK 494 each 12  .  hydrocortisone 2.5 % cream Apply topically 2 (two) times daily. For itching (Patient not taking: Reported on 02/26/2018) 30 g 0  . Lancets MISC Check sugar once daily dx DMII non-insulin no complications  ACCUCHECK 100 each 3  . levothyroxine (SYNTHROID, LEVOTHROID) 75 MCG tablet Take 1 tablet (75 mcg total) by mouth daily. 90 tablet 3  . losartan (COZAAR) 100 MG tablet Take 1 tablet (100 mg total) by mouth daily. 90 tablet 3  . meloxicam (MOBIC) 7.5 MG tablet TAKE 1 TABLET TWICE A DAY WITH FOOD AS NEEDED FOR PAIN 60 tablet 2  . metFORMIN (GLUCOPHAGE) 1000 MG tablet Take 1 tablet (1,000 mg total) by mouth 2 (two) times daily with a meal. 180 tablet 1   No current facility-administered medications for this visit.     Allergies:  Allergies  Allergen Reactions  . Hctz [Hydrochlorothiazide] Rash    Past Surgical History:  Procedure Laterality Date  . NO PAST SURGERIES    . THYROID SURGERY      Social History   Socioeconomic History  . Marital status: Married    Spouse name: Ayoub  . Number of children: 6  . Years of education: Not on file  . Highest education level: Not on file  Occupational History  . Not on file  Social Needs  . Financial resource strain: Not on file  . Food insecurity:    Worry: Not on file    Inability: Not on file  . Transportation needs:    Medical: Not on file    Non-medical: Not on file  Tobacco Use  . Smoking status: Never Smoker  . Smokeless tobacco: Never Used  . Tobacco comment: Moved to [place] to be closer to family- lives with son  Substance and Sexual Activity  . Alcohol use: No  . Drug use: No  . Sexual activity: Not Currently  Lifestyle  . Physical activity:    Days per week: Not on file    Minutes per session: Not on file  . Stress: Not on file  Relationships  . Social connections:    Talks on phone: Not on file    Gets together: Not on file    Attends religious service: Not on file    Active member of club or organization:  Not on file    Attends meetings of clubs or organizations: Not on file    Relationship status: Not on file  Other Topics Concern  . Not on file  Social History Narrative   ** Merged History Encounter **      Marital status: married yet separated from husband      Children: 6 children; 8 grandchildren      Lives: with son; husband lives in [place] yet separately.      Employment: retired.                History reviewed. No pertinent family history.   ROS Review of Systems See HPI Constitution: No fevers or chills No malaise No diaphoresis Skin: No rash or itching Eyes: no blurry vision, no double vision GU: no dysuria or hematuria Neuro: no dizziness or headaches  all others reviewed and negative   Objective: Vitals:   02/26/18 1507  BP: 138/72  Pulse: (!) 59  Resp: 16  Temp: 97.9 F (36.6 C)  TempSrc: Oral  Weight: 182 lb (82.6 kg)  Height: 5' 0.05" (1.525 m)    Physical Exam  Constitutional: She is oriented to person, place, and time. She appears well-developed and well-nourished.  HENT:  Head: Normocephalic and atraumatic.  Eyes: Conjunctivae and EOM are normal.  Cardiovascular: Normal rate, regular rhythm and normal heart sounds.  Pulmonary/Chest: Effort normal and breath sounds normal. No stridor. No respiratory distress.  Musculoskeletal:       Right shoulder: She exhibits tenderness, bony tenderness and crepitus. She exhibits no swelling, no effusion, no pain, no spasm and normal pulse.       Left shoulder: She exhibits bony tenderness and crepitus. She exhibits normal range of motion, no tenderness, no swelling, no effusion, no deformity, no laceration, no pain, no spasm, normal pulse and normal strength.  Neurological: She is alert and oriented to person, place, and time.   Tenderness of the AC joint on the right  Crepitus of the shoulders bilaterally   Assessment and Plan Ashley Frederick was seen today for back pain and arm  pain.  Diagnoses and all orders for this visit:  Age-related osteoporosis without current pathological fracture  Primary osteoarthritis involving multiple joints  Other orders -     diclofenac sodium (VOLTAREN) 1 % GEL; Apply 2 g topically 4 (four) times daily.   Continue centrium silver, calcium and vitamin D Continue regular exercise meloxicam 7.5mg  bid  with meals Follow up with [place]

## 2018-04-01 NOTE — Progress Notes (Signed)
Chief Complaint  Patient presents with  . rash on neck x 2 days with lump under chin that has grown bi    c/o pain all over body and per son giving tylenol with arthritis 8 hr, ? allergy to the tylenol arthritis but is unsure.    HPI  Pt reports that she has been having a lump under her chin this past 2 days It hands like a sac or an egg It is not painful or itchy  She wears her head covered in the tranditional muslim wear and it is right at the area of the neck where she pins her headpiece  Past Medical History:  Diagnosis Date  . Diabetes mellitus, type 2 ([place])   . DYSLIPIDEMIA   . HYPERTENSION   . HYPOTHYROIDISM   . OA (osteoarthritis) of knee   . OSTEOPOROSIS     Current Outpatient Medications  Medication Sig Dispense Refill  . alendronate (FOSAMAX) 70 MG tablet TAKE 1 TABLET EVERY 7 DAYS WITH A FULL GLASS OF WATER. DO NOT LIE DOWN FOR THE NEXT 30 MINS 12 tablet 3  . amLODipine (NORVASC) 10 MG tablet Take 1 tablet (10 mg total) by mouth daily. 90 tablet 3  . aspirin 81 MG chewable tablet Chew 1 tablet (81 mg total) by mouth daily. 90 tablet 3  . atenolol (TENORMIN) 100 MG tablet Take 1 tablet (100 mg total) by mouth daily. 90 tablet 3  . atorvastatin (LIPITOR) 40 MG tablet Take 1 tablet (40 mg total) by mouth daily at 6 PM. 90 tablet 3  . cetirizine (ZYRTEC) 10 MG tablet Take 1 tablet (10 mg total) by mouth daily. For itching and rash 30 tablet 11  . Cholecalciferol (VITAMIN D3) 1000 units CAPS Take 1 capsule (1,000 Units total) by mouth 2 (two) times daily. 60 capsule 11  . diclofenac sodium (VOLTAREN) 1 % GEL Apply 2 g topically 4 (four) times daily. 100 g 11  . glucose blood test strip Check sugar once daily dx: DMII non-insulin without complication  ACCUCHECK 956 each 12  . hydrocortisone 2.5 % cream Apply topically 2 (two) times daily. For itching 30 g 0  . Lancets MISC Check sugar once daily dx DMII non-insulin no complications  ACCUCHECK 100 each 3  . levothyroxine  (SYNTHROID, LEVOTHROID) 75 MCG tablet Take 1 tablet (75 mcg total) by mouth daily. 90 tablet 3  . losartan (COZAAR) 100 MG tablet Take 1 tablet (100 mg total) by mouth daily. 90 tablet 3  . meloxicam (MOBIC) 7.5 MG tablet TAKE 1 TABLET TWICE A DAY WITH FOOD AS NEEDED FOR PAIN 60 tablet 2  . metFORMIN (GLUCOPHAGE) 1000 MG tablet Take 1 tablet (1,000 mg total) by mouth 2 (two) times daily with a meal. 180 tablet 1   No current facility-administered medications for this visit.     Allergies:  Allergies  Allergen Reactions  . Hctz [Hydrochlorothiazide] Rash    Past Surgical History:  Procedure Laterality Date  . NO PAST SURGERIES    . THYROID SURGERY      Social History   Socioeconomic History  . Marital status: Married    Spouse name: Ayoub  . Number of children: 6  . Years of education: Not on file  . Highest education level: Not on file  Occupational History  . Not on file  Social Needs  . Financial resource strain: Not on file  . Food insecurity:    Worry: Not on file    Inability: Not on  file  . Transportation needs:    Medical: Not on file    Non-medical: Not on file  Tobacco Use  . Smoking status: Never Smoker  . Smokeless tobacco: Never Used  . Tobacco comment: Moved to [place] to be closer to family- lives with son  Substance and Sexual Activity  . Alcohol use: No  . Drug use: No  . Sexual activity: Not Currently  Lifestyle  . Physical activity:    Days per week: Not on file    Minutes per session: Not on file  . Stress: Not on file  Relationships  . Social connections:    Talks on phone: Not on file    Gets together: Not on file    Attends religious service: Not on file    Active member of club or organization: Not on file    Attends meetings of clubs or organizations: Not on file    Relationship status: Not on file  Other Topics Concern  . Not on file  Social History Narrative   ** Merged History Encounter **      Marital status: married yet  separated from husband      Children: 6 children; 8 grandchildren      Lives: with son; husband lives in [place] yet separately.      Employment: retired.                No family history on file.   ROS Review of Systems See HPI Constitution: No fevers or chills No malaise No diaphoresis Skin: No rash or itching Eyes: no blurry vision, no double vision GU: no dysuria or hematuria Neuro: no dizziness or headaches all others reviewed and negative   Objective: Vitals:   04/02/18 1453 04/02/18 1552  BP: (!) 162/89 126/72  Pulse: 65   Resp: 17   Temp: (!) 97.5 F (36.4 C)   TempSrc: Oral   SpO2: 96%   Weight: 179 lb 12.8 oz (81.6 kg)   Height: 5' 0.05" (1.525 m)     Physical Exam  Head normocephalic and atraumatic Egg size lesion that hands from the chin Soft Mobile nontender  Normal sinus rhythm, no murmur Lungs CTAB, no wheezing Extremities trace edema, warm  Assessment and [place] was seen today for rash on neck x 2 days with lump under chin that has grown bi.  Diagnoses and all orders for this visit:  Skin rash -     Ambulatory referral to Plastic Surgery  Lipoma of face- discussed that since it is not problematic she should just monitor it but patient wants it evaluated for removal.  Peripheral edema- improved with low sodium and leg elevation     Zoe A Stallings

## 2018-04-02 ENCOUNTER — Other Ambulatory Visit: Payer: Self-pay

## 2018-04-02 ENCOUNTER — Ambulatory Visit (INDEPENDENT_AMBULATORY_CARE_PROVIDER_SITE_OTHER): Payer: Medicare Other | Admitting: Family Medicine

## 2018-04-02 ENCOUNTER — Encounter: Payer: Self-pay | Admitting: Family Medicine

## 2018-04-02 VITALS — BP 126/72 | HR 65 | Temp 97.5°F | Resp 17 | Ht 60.05 in | Wt 179.8 lb

## 2018-04-02 DIAGNOSIS — R609 Edema, unspecified: Secondary | ICD-10-CM | POA: Diagnosis not present

## 2018-04-02 DIAGNOSIS — D170 Benign lipomatous neoplasm of skin and subcutaneous tissue of head, face and neck: Secondary | ICD-10-CM | POA: Diagnosis not present

## 2018-04-02 DIAGNOSIS — R21 Rash and other nonspecific skin eruption: Secondary | ICD-10-CM | POA: Diagnosis not present

## 2018-04-02 NOTE — Patient Instructions (Addendum)
1. Use Hydrocortisone twice a day to rash on the neck. Keep the area less covered so that the skin can breathe.   2. Keep legs elevated when sitting and try not to stand for more than 1 hour at a time.  3. Follow up for consultation about your lump under the chin   If you have lab work done today you will be contacted with your lab results within the next 2 weeks.  If you have not heard from [place] then please contact us. The fastest way to get your results is to register for My Chart.   IF you received an x-ray today, you will receive an invoice from [facility]. Please contact [facility] at [phone] with questions or concerns regarding your invoice.   IF you received labwork today, you will receive an invoice from [facility]. Please contact [facility] at [phone] with questions or concerns regarding your invoice.   Our billing staff will not be able to assist you with questions regarding bills from these companies.  You will be contacted with the lab results as soon as they are available. The fastest way to get your results is to activate your My Chart account. Instructions are located on the last page of this paperwork. If you have not heard from [place] regarding the results in 2 weeks, please contact this office.    Lipoma A lipoma is a noncancerous (benign) tumor that is made up of fat cells. This is a very common type of soft-tissue growth. Lipomas are usually found under the skin (subcutaneous). They may occur in any tissue of the body that contains fat. Common areas for lipomas to appear include the back, shoulders, buttocks, and thighs. Lipomas grow slowly, and they are usually painless. Most lipomas do not cause problems and do not require treatment. What are the causes? The cause of this condition is not known. What increases the risk? This condition is more likely to develop in:  People who are 48-42 years old.  People who have a family history of  lipomas.  What are the signs or symptoms? A lipoma usually appears as a small, round bump under the skin. It may feel soft or rubbery, but the firmness can vary. Most lipomas are not painful. However, a lipoma may become painful if it is located in an area where it pushes on nerves. How is this diagnosed? A lipoma can usually be diagnosed with a physical exam. You may also have tests to confirm the diagnosis and to rule out other conditions. Tests may include:  Imaging tests, such as a CT scan or MRI.  Removal of a tissue sample to be looked at under a microscope (biopsy).  How is this treated? Treatment is not needed for small lipomas that are not causing problems. If a lipoma continues to get bigger or it causes problems, removal is often the best option. Lipomas can also be removed to improve appearance. Removal of a lipoma is usually done with a surgery in which the fatty cells and the surrounding capsule are removed. Most often, a medicine that numbs the area (local anesthetic) is used for this procedure. Follow these instructions at home:  Keep all follow-up visits as directed by your health care provider. This is important. Contact a health care provider if:  Your lipoma becomes larger or hard.  Your lipoma becomes painful, red, or increasingly swollen. These could be signs of infection or a more serious condition. This information is not intended to replace advice  given to you by your health care provider. Make sure you discuss any questions you have with your health care provider. Document Released: 04/04/2002 Document Revised: 09/20/2015 Document Reviewed: 04/10/2014 Elsevier Interactive Patient Education  [employer].

## 2018-04-23 ENCOUNTER — Institutional Professional Consult (permissible substitution): Payer: Medicare Other | Admitting: Plastic Surgery

## 2018-05-04 DIAGNOSIS — Z7984 Long term (current) use of oral hypoglycemic drugs: Secondary | ICD-10-CM | POA: Diagnosis not present

## 2018-05-04 DIAGNOSIS — I10 Essential (primary) hypertension: Secondary | ICD-10-CM | POA: Diagnosis not present

## 2018-05-04 DIAGNOSIS — H35033 Hypertensive retinopathy, bilateral: Secondary | ICD-10-CM | POA: Diagnosis not present

## 2018-05-04 DIAGNOSIS — H04123 Dry eye syndrome of bilateral lacrimal glands: Secondary | ICD-10-CM | POA: Diagnosis not present

## 2018-05-04 DIAGNOSIS — H40013 Open angle with borderline findings, low risk, bilateral: Secondary | ICD-10-CM | POA: Diagnosis not present

## 2018-05-18 ENCOUNTER — Other Ambulatory Visit: Payer: Self-pay

## 2018-05-18 ENCOUNTER — Encounter: Payer: Self-pay | Admitting: Family Medicine

## 2018-05-18 ENCOUNTER — Ambulatory Visit (INDEPENDENT_AMBULATORY_CARE_PROVIDER_SITE_OTHER): Payer: Medicare Other | Admitting: Family Medicine

## 2018-05-18 ENCOUNTER — Ambulatory Visit (INDEPENDENT_AMBULATORY_CARE_PROVIDER_SITE_OTHER): Payer: Medicare Other

## 2018-05-18 VITALS — BP 156/88 | HR 65 | Temp 98.4°F | Resp 17 | Ht 60.05 in | Wt 180.6 lb

## 2018-05-18 DIAGNOSIS — E119 Type 2 diabetes mellitus without complications: Secondary | ICD-10-CM

## 2018-05-18 DIAGNOSIS — J069 Acute upper respiratory infection, unspecified: Secondary | ICD-10-CM | POA: Diagnosis not present

## 2018-05-18 DIAGNOSIS — I10 Essential (primary) hypertension: Secondary | ICD-10-CM

## 2018-05-18 DIAGNOSIS — R05 Cough: Secondary | ICD-10-CM

## 2018-05-18 DIAGNOSIS — R059 Cough, unspecified: Secondary | ICD-10-CM

## 2018-05-18 MED ORDER — GUAIFENESIN ER 600 MG PO TB12: 600.0000 mg | ORAL_TABLET | Freq: Two times a day (BID) | ORAL | 0 refills | Status: DC

## 2018-05-18 MED ORDER — BENZONATATE 200 MG PO CAPS: 200.0000 mg | ORAL_CAPSULE | Freq: Two times a day (BID) | ORAL | 0 refills | Status: DC | PRN

## 2018-05-18 NOTE — Patient Instructions (Addendum)
FINDINGS: The lungs are clear. Heart size is enlarged. No pneumothorax or pleural fluid. No acute or focal bony abnormality.  IMPRESSION: Cardiomegaly without acute disease.   Electronically Signed   By: Inge Rise M.D.   On: 05/18/2018 15:52    If you have lab work done today you will be contacted with your lab results within the next 2 weeks.  If you have not heard from [place] then please contact us. The fastest way to get your results is to register for My Chart.   IF you received an x-ray today, you will receive an invoice from [facility]. Please contact [facility] at [phone] with questions or concerns regarding your invoice.   IF you received labwork today, you will receive an invoice from [facility]. Please contact [facility] at [phone] with questions or concerns regarding your invoice.   Our billing staff will not be able to assist you with questions regarding bills from these companies.  You will be contacted with the lab results as soon as they are available. The fastest way to get your results is to activate your My Chart account. Instructions are located on the last page of this paperwork. If you have not heard from [place] regarding the results in 2 weeks, please contact this office.

## 2018-05-31 ENCOUNTER — Telehealth: Payer: Self-pay | Admitting: Family Medicine

## 2018-05-31 DIAGNOSIS — R609 Edema, unspecified: Secondary | ICD-10-CM

## 2018-05-31 DIAGNOSIS — M8949 Other hypertrophic osteoarthropathy, multiple sites: Secondary | ICD-10-CM

## 2018-05-31 DIAGNOSIS — M159 Polyosteoarthritis, unspecified: Secondary | ICD-10-CM

## 2018-05-31 DIAGNOSIS — M810 Age-related osteoporosis without current pathological fracture: Secondary | ICD-10-CM

## 2018-05-31 DIAGNOSIS — M150 Primary generalized (osteo)arthritis: Secondary | ICD-10-CM

## 2018-05-31 NOTE — Progress Notes (Signed)
Established Patient Office Visit  Subjective:  Patient ID: Ashley Frederick, female    DOB: 1941-01-14  Age: 78 y.o. MRN: [id number]  CC:  Chief Complaint  Patient presents with  . URI    x 1 week with fever, no measured warm to touch.  dizziness and unable to sleep due to cough.  Taking nyquil for symptoms  . Medication Refill    losartan potassium and atenolol    HPI Ashley Frederick presents for  New problem Patient is here for concern about a cough  Onset 1 week Associated symptoms: dizziness, sleep disturbance, drainage Denies: wheezing, shortness of breath, fevers Tried otc nyquil without improvement She is not sure what kind of nyquil   Established Problem Hypertension: Patient here for follow-up of elevated blood pressure. She is not exercising and is adherent to low salt diet.  Blood pressure is well controlled at home. Cardiac symptoms none. Patient denies chest pain, chest pressure/discomfort, dyspnea, fatigue, lower extremity edema, near-syncope and orthopnea.  Cardiovascular risk factors: advanced age (older than 32 for men, 58 for women), dyslipidemia, hypertension and obesity (BMI >= 30 kg/m2). Use of agents associated with hypertension: none. History of target organ damage: none. BP Readings from Last 3 Encounters:  05/18/18 (!) 156/88  04/02/18 126/72  02/26/18 138/72   Diabetes Mellitus She reports that she is not checking her sugars Lab Results  Component Value Date   HGBA1C 7.5 (A) 12/10/2017   She is compliant with her medications but is not eating much due to her cold symptoms Denies hypoglycemic symptoms such as weakness or palpitations   Past Medical History:  Diagnosis Date  . Diabetes mellitus, type 2 ([place])   . DYSLIPIDEMIA   . HYPERTENSION   . HYPOTHYROIDISM   . OA (osteoarthritis) of knee   . OSTEOPOROSIS     Past Surgical History:  Procedure Laterality Date  . NO PAST SURGERIES    . THYROID SURGERY      No  family history on file.  Social History   Socioeconomic History  . Marital status: Married    Spouse name: Ayoub  . Number of children: 6  . Years of education: Not on file  . Highest education level: Not on file  Occupational History  . Not on file  Social Needs  . Financial resource strain: Not on file  . Food insecurity:    Worry: Not on file    Inability: Not on file  . Transportation needs:    Medical: Not on file    Non-medical: Not on file  Tobacco Use  . Smoking status: Never Smoker  . Smokeless tobacco: Never Used  . Tobacco comment: Moved to [place] to be closer to family- lives with son  Substance and Sexual Activity  . Alcohol use: No  . Drug use: No  . Sexual activity: Not Currently  Lifestyle  . Physical activity:    Days per week: Not on file    Minutes per session: Not on file  . Stress: Not on file  Relationships  . Social connections:    Talks on phone: Not on file    Gets together: Not on file    Attends religious service: Not on file    Active member of club or organization: Not on file    Attends meetings of clubs or organizations: Not on file    Relationship status: Not on file  . Intimate partner violence:  Fear of current or ex partner: Not on file    Emotionally abused: Not on file    Physically abused: Not on file    Forced sexual activity: Not on file  Other Topics Concern  . Not on file  Social History Narrative   ** Merged History Encounter **      Marital status: married yet separated from husband      Children: 6 children; 8 grandchildren      Lives: with son; husband lives in [place] yet separately.      Employment: retired.                Outpatient Medications Prior to Visit  Medication Sig Dispense Refill  . alendronate (FOSAMAX) 70 MG tablet TAKE 1 TABLET EVERY 7 DAYS WITH A FULL GLASS OF WATER. DO NOT LIE DOWN FOR THE NEXT 30 MINS 12 tablet 3  . amLODipine (NORVASC) 10 MG tablet Take 1 tablet (10 mg total) by  mouth daily. 90 tablet 3  . aspirin 81 MG chewable tablet Chew 1 tablet (81 mg total) by mouth daily. 90 tablet 3  . atenolol (TENORMIN) 100 MG tablet Take 1 tablet (100 mg total) by mouth daily. 90 tablet 3  . atorvastatin (LIPITOR) 40 MG tablet Take 1 tablet (40 mg total) by mouth daily at 6 PM. 90 tablet 3  . cetirizine (ZYRTEC) 10 MG tablet Take 1 tablet (10 mg total) by mouth daily. For itching and rash 30 tablet 11  . Cholecalciferol (VITAMIN D3) 1000 units CAPS Take 1 capsule (1,000 Units total) by mouth 2 (two) times daily. 60 capsule 11  . diclofenac sodium (VOLTAREN) 1 % GEL Apply 2 g topically 4 (four) times daily. 100 g 11  . glucose blood test strip Check sugar once daily dx: DMII non-insulin without complication  ACCUCHECK 626 each 12  . hydrocortisone 2.5 % cream Apply topically 2 (two) times daily. For itching 30 g 0  . Lancets MISC Check sugar once daily dx DMII non-insulin no complications  ACCUCHECK 100 each 3  . levothyroxine (SYNTHROID, LEVOTHROID) 75 MCG tablet Take 1 tablet (75 mcg total) by mouth daily. 90 tablet 3  . losartan (COZAAR) 100 MG tablet Take 1 tablet (100 mg total) by mouth daily. 90 tablet 3  . meloxicam (MOBIC) 7.5 MG tablet TAKE 1 TABLET TWICE A DAY WITH FOOD AS NEEDED FOR PAIN 60 tablet 2  . metFORMIN (GLUCOPHAGE) 1000 MG tablet Take 1 tablet (1,000 mg total) by mouth 2 (two) times daily with a meal. 180 tablet 1   No facility-administered medications prior to visit.     Allergies  Allergen Reactions  . Hctz [Hydrochlorothiazide] Rash    ROS Review of Systems Review of Systems  Constitutional: Negative for activity change, appetite change, chills and fever.  HENT: Negative for congestion, nosebleeds, trouble swallowing and voice change.   Respiratory: +cough, no shortness of breath or wheezing.   Gastrointestinal: Negative for diarrhea, nausea and vomiting.  Genitourinary: Negative for difficulty urinating, dysuria, flank pain and hematuria.    Musculoskeletal: Negative for back pain, joint swelling and neck pain.  Neurological: Negative for dizziness, speech difficulty, light-headedness and numbness.  See HPI. All other review of systems negative.     Objective:    Physical Exam  BP (!) 156/88 (BP Location: Right Arm, Patient Position: Sitting, Cuff Size: Large)   Pulse 65   Temp 98.4 F (36.9 C) (Oral)   Resp 17   Ht 5' 0.05" (1.525  m)   Wt 180 lb 9.6 oz (81.9 kg)   SpO2 99%   BMI 35.21 kg/m  Wt Readings from Last 3 Encounters:  05/18/18 180 lb 9.6 oz (81.9 kg)  04/02/18 179 lb 12.8 oz (81.6 kg)  02/26/18 182 lb (82.6 kg)   General: alert, oriented, in NAD Head: normocephalic, atraumatic, no sinus tenderness Eyes: EOM intact, no scleral icterus or conjunctival injection Ears: TM clear bilaterally Nose: mucosa nonerythematous, nonedematous Throat: no pharyngeal exudate or erythema Lymph: no posterior auricular, submental or cervical lymph adenopathy Heart: normal rate, normal sinus rhythm, no murmurs Lungs: clear to auscultation bilaterally, no wheezing  CLINICAL DATA:  Cough.  EXAM: CHEST - 2 VIEW  COMPARISON:  PA and lateral chest 03/16/2017.  FINDINGS: The lungs are clear. Heart size is enlarged. No pneumothorax or pleural fluid. No acute or focal bony abnormality.  IMPRESSION: Cardiomegaly without acute disease.   Electronically Signed   By: Inge Rise M.D.   On: 05/18/2018 15:52   Health Maintenance Due  Topic Date Due  . OPHTHALMOLOGY EXAM  05/26/1950  . PNA vac Low Risk Adult (2 of 2 - PPSV23) 04/18/2018    There are no preventive care reminders to display for this patient.  Lab Results  Component Value Date   TSH 0.572 12/10/2017   Lab Results  Component Value Date   WBC 8.5 12/10/2017   HGB 11.7 12/10/2017   HCT 37.3 12/10/2017   MCV 84 12/10/2017   PLT 239 12/10/2017   Lab Results  Component Value Date   NA 140 12/10/2017   K 4.8 12/10/2017   CO2 23  12/10/2017   GLUCOSE 120 (H) 12/10/2017   BUN 27 12/10/2017   CREATININE 0.81 12/10/2017   BILITOT <0.2 12/10/2017   ALKPHOS 66 12/10/2017   AST 14 12/10/2017   ALT 15 12/10/2017   PROT 6.3 12/10/2017   ALBUMIN 3.9 12/10/2017   CALCIUM 8.8 12/10/2017   GFR 92.18 07/20/2012   Lab Results  Component Value Date   CHOL 162 12/10/2017   Lab Results  Component Value Date   HDL 78 12/10/2017   Lab Results  Component Value Date   LDLCALC 64 12/10/2017   Lab Results  Component Value Date   TRIG 101 12/10/2017   Lab Results  Component Value Date   CHOLHDL 2.1 12/10/2017   Lab Results  Component Value Date   HGBA1C 7.5 (A) 12/10/2017      Assessment & Plan:   Problem List Items Addressed This Visit      Cardiovascular and Mediastinum   Essential hypertension    Other Visit Diagnoses    Cough    -  Primary   Relevant Medications   guaiFENesin (MUCINEX) 600 MG 12 hr tablet   benzonatate (TESSALON) 200 MG capsule   Other Relevant Orders   DG Chest 2 View (Completed)   Acute URI       Relevant Medications   guaiFENesin (MUCINEX) 600 MG 12 hr tablet   benzonatate (TESSALON) 200 MG capsule     Medical Decision Making Acute URI  Cough  Discussed that some of the otc cough medications have dextromorphan and phenylephrine which can raise blood glucose and blood pressure Discussed that her symptoms sound like postnasal drip and a cold cxr was checked since pt has a very sedentary lifestyle and is immunocompromised due to diabetes Her cxr was clear for consolidation or effusion thus will treat with supportive care  For hypertension: she should continue her  current meds, reviewed that she has plenty of refills For Diabetes:  Advised to eat small meals at regular intervals to avoid hypoglycemia and to maintain hydration but avoid orange juice  Meds ordered this encounter  Medications  . guaiFENesin (MUCINEX) 600 MG 12 hr tablet    Sig: Take 1 tablet (600 mg total) by  mouth 2 (two) times daily.    Dispense:  60 tablet    Refill:  0  . benzonatate (TESSALON) 200 MG capsule    Sig: Take 1 capsule (200 mg total) by mouth 2 (two) times daily as needed for cough.    Dispense:  30 capsule    Refill:  0    Follow-up: No follow-ups on file.    Forrest Moron, MD

## 2018-05-31 NOTE — Telephone Encounter (Signed)
Please advise 

## 2018-05-31 NOTE — Telephone Encounter (Signed)
Pt husband came in and asked that Dr.Stallings finish putting in request for medical bed to be sent to home . He has one but wife needs one FR

## 2018-06-04 NOTE — Telephone Encounter (Signed)
Pt is wanting Medical Bed .This is follow up on message for last week'

## 2018-06-05 NOTE — Telephone Encounter (Signed)
Message has been sent to provider for review.

## 2018-06-11 NOTE — Telephone Encounter (Signed)
Please leave for me to sign

## 2018-06-14 NOTE — Telephone Encounter (Signed)
Pt left no form.  Rx for medical bed needed. Dgaddy, CMA

## 2018-06-22 ENCOUNTER — Encounter: Payer: Self-pay | Admitting: Family Medicine

## 2018-06-22 DIAGNOSIS — M549 Dorsalgia, unspecified: Secondary | ICD-10-CM

## 2018-06-22 DIAGNOSIS — G8929 Other chronic pain: Secondary | ICD-10-CM | POA: Insufficient documentation

## 2018-06-22 NOTE — Telephone Encounter (Addendum)
Ayoub calling to inquire about hospital bed. He states he is going to come by the clinic tomorrow to pick up a Rx for the hospital bed.  Or the company says call them at [phone]

## 2018-06-28 DIAGNOSIS — M810 Age-related osteoporosis without current pathological fracture: Secondary | ICD-10-CM | POA: Diagnosis not present

## 2018-07-01 DIAGNOSIS — M25561 Pain in right knee: Secondary | ICD-10-CM | POA: Diagnosis not present

## 2018-07-01 DIAGNOSIS — M25562 Pain in left knee: Secondary | ICD-10-CM | POA: Diagnosis not present

## 2018-07-05 DIAGNOSIS — M810 Age-related osteoporosis without current pathological fracture: Secondary | ICD-10-CM | POA: Diagnosis not present

## 2018-07-08 DIAGNOSIS — G8929 Other chronic pain: Secondary | ICD-10-CM | POA: Diagnosis not present

## 2018-07-08 DIAGNOSIS — M549 Dorsalgia, unspecified: Secondary | ICD-10-CM | POA: Diagnosis not present

## 2018-07-08 DIAGNOSIS — M79672 Pain in left foot: Secondary | ICD-10-CM | POA: Diagnosis not present

## 2018-07-26 ENCOUNTER — Other Ambulatory Visit: Payer: Self-pay | Admitting: Family Medicine

## 2018-07-26 MED ORDER — GLUCOSE BLOOD VI STRP: ORAL_STRIP | 12 refills | Status: DC

## 2018-07-26 MED ORDER — ALENDRONATE SODIUM 70 MG PO TABS: ORAL_TABLET | ORAL | 3 refills | Status: AC

## 2018-07-26 NOTE — Progress Notes (Signed)
Refilled meds

## 2018-08-08 DIAGNOSIS — M549 Dorsalgia, unspecified: Secondary | ICD-10-CM | POA: Diagnosis not present

## 2018-08-08 DIAGNOSIS — M79672 Pain in left foot: Secondary | ICD-10-CM | POA: Diagnosis not present

## 2018-08-08 DIAGNOSIS — G8929 Other chronic pain: Secondary | ICD-10-CM | POA: Diagnosis not present

## 2018-08-09 DIAGNOSIS — M47816 Spondylosis without myelopathy or radiculopathy, lumbar region: Secondary | ICD-10-CM | POA: Diagnosis not present

## 2018-08-09 DIAGNOSIS — M25551 Pain in right hip: Secondary | ICD-10-CM | POA: Diagnosis not present

## 2018-08-19 DIAGNOSIS — M810 Age-related osteoporosis without current pathological fracture: Secondary | ICD-10-CM | POA: Diagnosis not present

## 2018-08-31 ENCOUNTER — Ambulatory Visit: Payer: Medicare Other | Admitting: Podiatry

## 2018-09-07 DIAGNOSIS — M549 Dorsalgia, unspecified: Secondary | ICD-10-CM | POA: Diagnosis not present

## 2018-09-07 DIAGNOSIS — G8929 Other chronic pain: Secondary | ICD-10-CM | POA: Diagnosis not present

## 2018-09-07 DIAGNOSIS — M79672 Pain in left foot: Secondary | ICD-10-CM | POA: Diagnosis not present

## 2018-09-09 ENCOUNTER — Telehealth: Payer: Self-pay

## 2018-09-09 NOTE — Telephone Encounter (Signed)
Spoke with pharmacist about request for new rx's for strips testing of 6x's/ day.  I advised pharmacist per stallings pt should only be testing 3 x daily as blood sugars are well controlled.  Pharmacist will change in system and notify pt.

## 2018-10-08 DIAGNOSIS — M549 Dorsalgia, unspecified: Secondary | ICD-10-CM | POA: Diagnosis not present

## 2018-10-08 DIAGNOSIS — G8929 Other chronic pain: Secondary | ICD-10-CM | POA: Diagnosis not present

## 2018-10-08 DIAGNOSIS — M79672 Pain in left foot: Secondary | ICD-10-CM | POA: Diagnosis not present

## 2018-11-07 DIAGNOSIS — M79672 Pain in left foot: Secondary | ICD-10-CM | POA: Diagnosis not present

## 2018-11-07 DIAGNOSIS — M549 Dorsalgia, unspecified: Secondary | ICD-10-CM | POA: Diagnosis not present

## 2018-11-07 DIAGNOSIS — G8929 Other chronic pain: Secondary | ICD-10-CM | POA: Diagnosis not present

## 2018-11-20 ENCOUNTER — Other Ambulatory Visit: Payer: Self-pay | Admitting: Family Medicine

## 2018-11-20 NOTE — Telephone Encounter (Signed)
Forwarding medication refill request to PCP for review. 

## 2018-11-23 ENCOUNTER — Other Ambulatory Visit: Payer: Self-pay | Admitting: Family Medicine

## 2018-11-23 NOTE — Telephone Encounter (Signed)
Requested Prescriptions  Pending Prescriptions Disp Refills  . losartan (COZAAR) 100 MG tablet [Pharmacy Med Name: LOSARTAN POTASSIUM 100 MG TAB] 30 tablet 0    Sig: TAKE 1 TABLET BY MOUTH EVERY DAY     Cardiovascular:  Angiotensin Receptor Blockers Failed - 11/23/2018  1:20 AM      Failed - Cr in normal range and within 180 days    Creat  Date Value Ref Range Status  04/05/2015 0.80 0.60 - 0.93 mg/dL Final   Creatinine, Ser  Date Value Ref Range Status  12/10/2017 0.81 0.57 - 1.00 mg/dL Final         Failed - K in normal range and within 180 days    Potassium  Date Value Ref Range Status  12/10/2017 4.8 3.5 - 5.2 mmol/L Final         Failed - Last BP in normal range    BP Readings from Last 1 Encounters:  05/18/18 (!) 156/88         Failed - Valid encounter within last 6 months    Recent Outpatient Visits          6 months ago Cough   [facility] at [place], MD   7 months ago Lipoma of face   [facility] at [facility], Arlie Solomons, MD   9 months ago Age-related osteoporosis without current pathological fracture   [facility] Care at [facility], Arlie Solomons, MD   10 months ago Age-related osteoporosis without current pathological fracture   Primary Care at [facility], Arlie Solomons, MD   11 months ago Peripheral edema   [facility] at [place], MD             Passed - Patient is not pregnant      . atenolol (TENORMIN) 100 MG tablet [Pharmacy Med Name: ATENOLOL 100 MG TABLET] 30 tablet 0    Sig: TAKE 1 TABLET BY MOUTH EVERY DAY     Cardiovascular:  Beta Blockers Failed - 11/23/2018  1:20 AM      Failed - Last BP in normal range    BP Readings from Last 1 Encounters:  05/18/18 (!) 156/88         Failed - Valid encounter within last 6 months    Recent Outpatient Visits          6 months ago Cough   [facility] at [facility], Arlie Solomons, MD   7 months ago Lipoma of face   [facility] at [facility] [place], MD   9 months  ago Age-related osteoporosis without current pathological fracture   [facility] at [facility], Arlie Solomons, MD   10 months ago Age-related osteoporosis without current pathological fracture   [facility] at [facility], Arlie Solomons, MD   11 months ago Peripheral edema   [facility] at [place], MD             Passed - Last Heart Rate in normal range    Pulse Readings from Last 1 Encounters:  05/18/18 65         Phone call to patient, spoke with her son.  Transferred him to [place] to schedule follow up appointment for his mother, he got disconnected before transfer was complete.  Scheduler at [place] states she will call him back to schedule patient.  30 day courtesy refills sent.

## 2018-12-01 ENCOUNTER — Other Ambulatory Visit: Payer: Self-pay

## 2018-12-01 ENCOUNTER — Telehealth (INDEPENDENT_AMBULATORY_CARE_PROVIDER_SITE_OTHER): Payer: Medicaid Other | Admitting: Family Medicine

## 2018-12-01 DIAGNOSIS — M150 Primary generalized (osteo)arthritis: Secondary | ICD-10-CM

## 2018-12-01 DIAGNOSIS — E7800 Pure hypercholesterolemia, unspecified: Secondary | ICD-10-CM

## 2018-12-01 DIAGNOSIS — M8949 Other hypertrophic osteoarthropathy, multiple sites: Secondary | ICD-10-CM

## 2018-12-01 DIAGNOSIS — I10 Essential (primary) hypertension: Secondary | ICD-10-CM | POA: Diagnosis not present

## 2018-12-01 DIAGNOSIS — E119 Type 2 diabetes mellitus without complications: Secondary | ICD-10-CM | POA: Diagnosis not present

## 2018-12-01 DIAGNOSIS — M159 Polyosteoarthritis, unspecified: Secondary | ICD-10-CM

## 2018-12-01 DIAGNOSIS — E039 Hypothyroidism, unspecified: Secondary | ICD-10-CM

## 2018-12-01 NOTE — Progress Notes (Signed)
Telemedicine Encounter- SOAP NOTE Established Patient  This telephone encounter was conducted with the patient's (or proxy's) verbal consent via audio telecommunications: yes/no: Yes Patient was instructed to have this encounter in a suitably private space; and to only have persons present to whom they give permission to participate. In addition, patient identity was confirmed by use of name plus two identifiers (DOB and address).  I discussed the limitations, risks, security and privacy concerns of performing an evaluation and management service by telephone and the availability of in person appointments. I also discussed with the patient that there may be a patient responsible charge related to this service. The patient expressed understanding and agreed to proceed.  I spent a total of TIME; 0 MIN TO 60 MIN: 25 minutes talking with the patient or their proxy.  CC: thyroid and diabetes Subjective   Ashley Frederick is a 78 y.o. established patient. Telephone visit today for  HPI  Translator ID: [id number]   Diabetes Mellitus: Patient presents for follow up of diabetes. Symptoms: none. Symptoms have stabilized. Patient denies hyperglycemia, hypoglycemia , increase appetite, paresthesia of the feet and polydipsia.  Evaluation to date has been included: hemoglobin A1C.  Home sugars: BGs range between 130 and 140. Treatment to date: Continued metformin which has been effective.  Lab Results  Component Value Date   HGBA1C 7.5 (A) 12/10/2017   Hypertension: Patient here for follow-up of elevated blood pressure. She is not exercising and is adherent to low salt diet.  Blood pressure is well controlled at home. Cardiac symptoms none. Patient denies chest pain, chest pressure/discomfort, claudication, dyspnea, exertional chest pressure/discomfort, fatigue, irregular heart beat and lower extremity edema.  Cardiovascular risk factors: advanced age (older than 19 for men, 73 for women),  diabetes mellitus, dyslipidemia, hypertension, obesity (BMI >= 30 kg/m2) and sedentary lifestyle. Use of agents associated with hypertension: NSAIDS.  She is on amlodipine, atenolol, losartan. BP Readings from Last 3 Encounters:  05/18/18 (!) 156/88  04/02/18 126/72  02/26/18 138/72   Thyroid: Patient presents for evaluation of hypothyroidism. Current symptoms include denies fatigue, weight changes, heat/cold intolerance, bowel/skin changes or CVS symptoms.   Lab Results  Component Value Date   TSH 0.572 12/10/2017   Arthritis She is taking meloxicam 7.5mg  and needs a refill Most of the pain is in her hands. She states that it helps so that she can still cook and do some housework.   Patient Active Problem List   Diagnosis Date Noted   Chronic back pain 06/22/2018   Left foot pain 12/18/2016   Arthritis of foot, left 12/18/2016   Rash 04/17/2012   Vitamin B12 deficiency 05/15/2011   Diabetes mellitus, type 2 ([place]) 12/19/2010   SYNCOPE 07/19/2009   FATIGUE 06/06/2009   Hypothyroidism 03/12/2009   Essential hypertension 03/12/2009   ARTHRITIS, RIGHT KNEE 03/12/2009   Osteoporosis 03/12/2009    Past Medical History:  Diagnosis Date   Diabetes mellitus, type 2 ([facility])    DYSLIPIDEMIA    HYPERTENSION    HYPOTHYROIDISM    OA (osteoarthritis) of knee    OSTEOPOROSIS     Current Outpatient Medications  Medication Sig Dispense Refill   alendronate (FOSAMAX) 70 MG tablet TAKE 1 TABLET EVERY 7 DAYS WITH A FULL GLASS OF WATER. DO NOT LIE DOWN FOR THE NEXT 30 MINS 12 tablet 3   amLODipine (NORVASC) 10 MG tablet Take 1 tablet (10 mg total) by mouth daily. 90 tablet 3   aspirin 81 MG chewable  tablet Chew 1 tablet (81 mg total) by mouth daily. 90 tablet 3   atenolol (TENORMIN) 100 MG tablet TAKE 1 TABLET BY MOUTH EVERY DAY 30 tablet 0   atorvastatin (LIPITOR) 40 MG tablet Take 1 tablet (40 mg total) by mouth daily at 6 PM. 90 tablet 3   benzonatate (TESSALON)  200 MG capsule Take 1 capsule (200 mg total) by mouth 2 (two) times daily as needed for cough. 30 capsule 0   cetirizine (ZYRTEC) 10 MG tablet Take 1 tablet (10 mg total) by mouth daily. For itching and rash 30 tablet 11   Cholecalciferol (VITAMIN D3) 1000 units CAPS Take 1 capsule (1,000 Units total) by mouth 2 (two) times daily. 60 capsule 11   diclofenac sodium (VOLTAREN) 1 % GEL Apply 2 g topically 4 (four) times daily. 100 g 11   glucose blood test strip Check sugar once daily dx: DMII non-insulin without complication  ACCUCHECK 299 each 12   guaiFENesin (MUCINEX) 600 MG 12 hr tablet Take 1 tablet (600 mg total) by mouth 2 (two) times daily. 60 tablet 0   hydrocortisone 2.5 % cream Apply topically 2 (two) times daily. For itching 30 g 0   Lancets MISC Check sugar once daily dx DMII non-insulin no complications  ACCUCHECK 100 each 3   levothyroxine (SYNTHROID, LEVOTHROID) 75 MCG tablet Take 1 tablet (75 mcg total) by mouth daily. 90 tablet 3   losartan (COZAAR) 100 MG tablet TAKE 1 TABLET BY MOUTH EVERY DAY 30 tablet 0   meloxicam (MOBIC) 7.5 MG tablet TAKE 1 TABLET TWICE A DAY WITH FOOD AS NEEDED FOR PAIN 60 tablet 2   metFORMIN (GLUCOPHAGE) 1000 MG tablet TAKE 1 TABLET (1,000 MG TOTAL) BY MOUTH 2 (TWO) TIMES DAILY WITH A MEAL. 180 tablet 1   No current facility-administered medications for this visit.     Allergies  Allergen Reactions   Hctz [Hydrochlorothiazide] Rash    Social History   Socioeconomic History   Marital status: Married    Spouse name: Ayoub   Number of children: 6   Years of education: Not on file   Highest education level: Not on file  Occupational History   Not on file  Social Needs   Financial resource strain: Not on file   Food insecurity    Worry: Not on file    Inability: Not on file   Transportation needs    Medical: Not on file    Non-medical: Not on file  Tobacco Use   Smoking status: Never Smoker   Smokeless tobacco: Never  Used   Tobacco comment: Moved to [place] to be closer to family- lives with son  Substance and Sexual Activity   Alcohol use: No   Drug use: No   Sexual activity: Not Currently  Lifestyle   Physical activity    Days per week: Not on file    Minutes per session: Not on file   Stress: Not on file  Relationships   Social connections    Talks on phone: Not on file    Gets together: Not on file    Attends religious service: Not on file    Active member of club or organization: Not on file    Attends meetings of clubs or organizations: Not on file    Relationship status: Not on file   Intimate partner violence    Fear of current or ex partner: Not on file    Emotionally abused: Not on file  Physically abused: Not on file    Forced sexual activity: Not on file  Other Topics Concern   Not on file  Social History Narrative   ** Merged History Encounter **      Marital status: married yet separated from husband      Children: 6 children; 8 grandchildren      Lives: with son; husband lives in [place] yet separately.      Employment: retired.                ROS  Review of Systems  Constitutional: Negative for activity change, appetite change, chills and fever.  HENT: Negative for congestion, nosebleeds, trouble swallowing and voice change.   Respiratory: Negative for cough, shortness of breath and wheezing.   Gastrointestinal: Negative for diarrhea, nausea and vomiting.  Genitourinary: Negative for difficulty urinating, dysuria, flank pain and hematuria.  Musculoskeletal: Negative for back pain, joint swelling and neck pain.  Neurological: Negative for dizziness, speech difficulty, light-headedness and numbness.  See HPI. All other review of systems negative.   Objective   Vitals as reported by the patient:  There were no vitals filed for this visit.  normal pulmonary effort  Diagnoses and all orders for this visit:  Type 2 diabetes mellitus without  complication, without long-term current use of insulin ([place]) - well controlled Home blood glucose at goal Lipids monitored and renal function in range On metformin On arb On asa 81mg  Reviewed diabetic foot care Emphasized importance of eye and dental exam   Primary osteoarthritis involving multiple joints - continue meloxicam with food prn  Essential hypertension- Patient's blood pressure is at goal of 139/89 or less. Condition is stable. Continue current medications and treatment plan. I recommend that you exercise for 30-45 minutes 5 days a week. I also recommend a balanced diet with fruits and vegetables every day, lean meats, and little fried foods. The DASH diet (you can find this online) is a good example of this.   Pure hypercholesterolemia - Discussed medications that affect lipids Reminded patient to avoid grapefruits Reviewed last 3 lipids Discussed current meds: statin, aspirin Advised dietary fiber and fish oil and ways to keep HDL high CAD prevention and reviewed side effects of statins   Acquired hypothyroidism- discussed that will check levels before refilling her medications.      I discussed the assessment and treatment plan with the patient. The patient was provided an opportunity to ask questions and all were answered. The patient agreed with the plan and demonstrated an understanding of the instructions.   The patient was advised to call back or seek an in-person evaluation if the symptoms worsen or if the condition fails to improve as anticipated.  I provided 25 minutes of non-face-to-face time during this encounter.  Forrest Moron, MD  Primary Care at [facility]

## 2018-12-06 ENCOUNTER — Ambulatory Visit: Payer: Medicaid Other

## 2018-12-06 DIAGNOSIS — I10 Essential (primary) hypertension: Secondary | ICD-10-CM | POA: Diagnosis not present

## 2018-12-06 DIAGNOSIS — E7800 Pure hypercholesterolemia, unspecified: Secondary | ICD-10-CM | POA: Diagnosis not present

## 2018-12-06 DIAGNOSIS — E039 Hypothyroidism, unspecified: Secondary | ICD-10-CM

## 2018-12-06 DIAGNOSIS — E119 Type 2 diabetes mellitus without complications: Secondary | ICD-10-CM | POA: Diagnosis not present

## 2018-12-07 ENCOUNTER — Other Ambulatory Visit: Payer: Self-pay | Admitting: Family Medicine

## 2018-12-07 LAB — LIPID PANEL
Chol/HDL Ratio: 2.8 ratio (ref 0.0–4.4)
Cholesterol, Total: 150 mg/dL (ref 100–199)
HDL: 54 mg/dL (ref >39)
LDL Calculated: 74 mg/dL (ref 0–99)
Triglycerides: 110 mg/dL (ref 0–149)
VLDL Cholesterol Cal: 22 mg/dL (ref 5–40)

## 2018-12-07 LAB — CMP14+EGFR
ALT: 8 IU/L (ref 0–32)
AST: 13 IU/L (ref 0–40)
Albumin/Globulin Ratio: 1.9 (ref 1.2–2.2)
Albumin: 4.3 g/dL (ref 3.7–4.7)
Alkaline Phosphatase: 70 IU/L (ref 39–117)
BUN/Creatinine Ratio: 22 (ref 12–28)
BUN: 19 mg/dL (ref 8–27)
Bilirubin Total: 0.3 mg/dL (ref 0.0–1.2)
CO2: 24 mmol/L (ref 20–29)
Calcium: 9.3 mg/dL (ref 8.7–10.3)
Chloride: 100 mmol/L (ref 96–106)
Creatinine, Ser: 0.85 mg/dL (ref 0.57–1.00)
GFR calc Af Amer: 76 mL/min/{1.73_m2} (ref >59)
GFR calc non Af Amer: 66 mL/min/{1.73_m2} (ref >59)
Globulin, Total: 2.3 g/dL (ref 1.5–4.5)
Glucose: 115 mg/dL — ABNORMAL HIGH (ref 65–99)
Potassium: 4.7 mmol/L (ref 3.5–5.2)
Sodium: 139 mmol/L (ref 134–144)
Total Protein: 6.6 g/dL (ref 6.0–8.5)

## 2018-12-07 LAB — CBC
Hematocrit: 35.7 % (ref 34.0–46.6)
Hemoglobin: 11.3 g/dL (ref 11.1–15.9)
MCH: 25.4 pg — ABNORMAL LOW (ref 26.6–33.0)
MCHC: 31.7 g/dL (ref 31.5–35.7)
MCV: 80 fL (ref 79–97)
Platelets: 273 10*3/uL (ref 150–450)
RBC: 4.45 x10E6/uL (ref 3.77–5.28)
RDW: 14.2 % (ref 11.7–15.4)
WBC: 8 10*3/uL (ref 3.4–10.8)

## 2018-12-07 LAB — HEMOGLOBIN A1C
Est. average glucose Bld gHb Est-mCnc: 154 mg/dL
Hgb A1c MFr Bld: 7 % — ABNORMAL HIGH (ref 4.8–5.6)

## 2018-12-07 LAB — TSH: TSH: 4.57 u[IU]/mL — ABNORMAL HIGH (ref 0.450–4.500)

## 2018-12-07 MED ORDER — LEVOTHYROXINE SODIUM 75 MCG PO TABS: 75.0000 ug | ORAL_TABLET | Freq: Every day | ORAL | 3 refills | Status: DC

## 2018-12-08 DIAGNOSIS — M79672 Pain in left foot: Secondary | ICD-10-CM | POA: Diagnosis not present

## 2018-12-08 DIAGNOSIS — M549 Dorsalgia, unspecified: Secondary | ICD-10-CM | POA: Diagnosis not present

## 2018-12-08 DIAGNOSIS — G8929 Other chronic pain: Secondary | ICD-10-CM | POA: Diagnosis not present

## 2018-12-20 ENCOUNTER — Encounter: Payer: Self-pay | Admitting: Family Medicine

## 2018-12-20 DIAGNOSIS — Z1231 Encounter for screening mammogram for malignant neoplasm of breast: Secondary | ICD-10-CM | POA: Diagnosis not present

## 2018-12-22 ENCOUNTER — Other Ambulatory Visit: Payer: Self-pay | Admitting: Family Medicine

## 2018-12-22 MED ORDER — GLUCOSE BLOOD VI STRP: ORAL_STRIP | 12 refills | Status: DC

## 2018-12-24 ENCOUNTER — Other Ambulatory Visit: Payer: Self-pay | Admitting: Family Medicine

## 2019-01-08 DIAGNOSIS — M79672 Pain in left foot: Secondary | ICD-10-CM | POA: Diagnosis not present

## 2019-01-08 DIAGNOSIS — M549 Dorsalgia, unspecified: Secondary | ICD-10-CM | POA: Diagnosis not present

## 2019-01-08 DIAGNOSIS — G8929 Other chronic pain: Secondary | ICD-10-CM | POA: Diagnosis not present

## 2019-01-13 ENCOUNTER — Other Ambulatory Visit: Payer: Self-pay

## 2019-01-13 ENCOUNTER — Encounter: Payer: Self-pay | Admitting: Family Medicine

## 2019-01-13 ENCOUNTER — Ambulatory Visit (INDEPENDENT_AMBULATORY_CARE_PROVIDER_SITE_OTHER): Payer: Medicare Other | Admitting: Family Medicine

## 2019-01-13 VITALS — BP 175/95 | HR 65 | Temp 97.8°F | Ht 60.0 in | Wt 181.0 lb

## 2019-01-13 DIAGNOSIS — M549 Dorsalgia, unspecified: Secondary | ICD-10-CM

## 2019-01-13 DIAGNOSIS — Z23 Encounter for immunization: Secondary | ICD-10-CM

## 2019-01-13 DIAGNOSIS — I10 Essential (primary) hypertension: Secondary | ICD-10-CM | POA: Diagnosis not present

## 2019-01-13 LAB — POCT URINALYSIS DIP (MANUAL ENTRY)
Bilirubin, UA: NEGATIVE
Blood, UA: NEGATIVE
Glucose, UA: NEGATIVE mg/dL
Nitrite, UA: NEGATIVE
Protein Ur, POC: >=300 mg/dL — AB
Spec Grav, UA: 1.02 (ref 1.010–1.025)
Urobilinogen, UA: 0.2 E.U./dL
pH, UA: 5.5 (ref 5.0–8.0)

## 2019-01-13 MED ORDER — ATENOLOL 100 MG PO TABS: 100.0000 mg | ORAL_TABLET | Freq: Every day | ORAL | 3 refills | Status: DC

## 2019-01-13 MED ORDER — LOSARTAN POTASSIUM 100 MG PO TABS: 100.0000 mg | ORAL_TABLET | Freq: Every day | ORAL | 3 refills | Status: DC

## 2019-01-13 NOTE — Progress Notes (Signed)
Patient ID: Ashley Frederick, female    DOB: 09-17-1940  Age: 78 y.o. MRN: [id number]  Chief Complaint  Patient presents with  . Pain    back and leg pain since yesterday    Subjective:   Patient is here for her back pain.  She speaks Arabic.  She had her husband who does not interpret very well so we use the online interpreter.  She has been hurting for about 3 years.  A year ago she got an injection in her spine which helped quite a lot.  She does have some Voltaren gel at home that she has not been using.  She occasionally takes Tylenol.  She had questions about her daily aspirin which assured her that she should continue to take  She needs a refill on her blood pressure medicines.  Current allergies, medications, problem list, past/family and social histories reviewed.  Objective:  BP (!) 175/95   Pulse 65   Temp 97.8 F (36.6 C)   Ht 5' (1.524 m)   Wt 181 lb (82.1 kg)   SpO2 95%   BMI 35.35 kg/m   No major acute distress.  Very tender in her lower spine area.  No radiation of pain.  Got flu shot today.  Assessment & Plan:   Assessment: 1. Back pain, unspecified back location, unspecified back pain laterality, unspecified chronicity   2. Need for prophylactic vaccination and inoculation against influenza   3. Need for immunization against influenza   4. Essential hypertension       Plan: See instructions.  Orders Placed This Encounter  Procedures  . Flu Vaccine QUAD High Dose(Fluad)  . POCT urinalysis dipstick    Meds ordered this encounter  Medications  . atenolol (TENORMIN) 100 MG tablet    Sig: Take 1 tablet (100 mg total) by mouth daily.    Dispense:  90 tablet    Refill:  3  . losartan (COZAAR) 100 MG tablet    Sig: Take 1 tablet (100 mg total) by mouth daily.    Dispense:  90 tablet    Refill:  3         Patient Instructions    You have received your flu shot today  Use the Voltaren gel on your back 2 or 3 times a day if  needed for back pain  Take Tylenol (acetaminophen) 500 mg 2 pills 2 times daily regularly for your back pain  Make an appointment with the doctor in [place] who injected your back that helped and follow-up with him next month as planned  I have represcribed your blood pressure medicine for 3 months at a time.  Return if problems arise   If you have lab work done today you will be contacted with your lab results within the next 2 weeks.  If you have not heard from [place] then please contact us. The fastest way to get your results is to register for My Chart.   IF you received an x-ray today, you will receive an invoice from [facility]. Please contact [facility] at [phone] with questions or concerns regarding your invoice.   IF you received labwork today, you will receive an invoice from [place]. Please contact [facility] at [phone] with questions or concerns regarding your invoice.   Our billing staff will not be able to assist you with questions regarding bills from these companies.  You will be contacted with the lab results as soon as they are available. The fastest  way to get your results is to activate your My Chart account. Instructions are located on the last page of this paperwork. If you have not heard from [place] regarding the results in 2 weeks, please contact this office.        Return in about 3 months (around 04/14/2019), or [place], for routine recheck.   Ruben Reason, MD 01/13/2019

## 2019-01-13 NOTE — Patient Instructions (Addendum)
You have received your flu shot today  Use the Voltaren gel on your back 2 or 3 times a day if needed for back pain  Take Tylenol (acetaminophen) 500 mg 2 pills 2 times daily regularly for your back pain  Make an appointment with the doctor in [place] who injected your back that helped and follow-up with him next month as planned  I have represcribed your blood pressure medicine for 3 months at a time.  Return if problems arise   If you have lab work done today you will be contacted with your lab results within the next 2 weeks.  If you have not heard from [place] then please contact us. The fastest way to get your results is to register for My Chart.   IF you received an x-ray today, you will receive an invoice from [facility]. Please contact [facility] at [phone] with questions or concerns regarding your invoice.   IF you received labwork today, you will receive an invoice from [place]. Please contact [facility] at [phone] with questions or concerns regarding your invoice.   Our billing staff will not be able to assist you with questions regarding bills from these companies.  You will be contacted with the lab results as soon as they are available. The fastest way to get your results is to activate your My Chart account. Instructions are located on the last page of this paperwork. If you have not heard from [place] regarding the results in 2 weeks, please contact this office.

## 2019-02-07 ENCOUNTER — Other Ambulatory Visit: Payer: Self-pay | Admitting: Family Medicine

## 2019-02-07 DIAGNOSIS — M549 Dorsalgia, unspecified: Secondary | ICD-10-CM | POA: Diagnosis not present

## 2019-02-07 DIAGNOSIS — G8929 Other chronic pain: Secondary | ICD-10-CM | POA: Diagnosis not present

## 2019-02-07 DIAGNOSIS — M79672 Pain in left foot: Secondary | ICD-10-CM | POA: Diagnosis not present

## 2019-02-15 DIAGNOSIS — M1711 Unilateral primary osteoarthritis, right knee: Secondary | ICD-10-CM | POA: Diagnosis not present

## 2019-02-15 DIAGNOSIS — M25571 Pain in right ankle and joints of right foot: Secondary | ICD-10-CM | POA: Diagnosis not present

## 2019-02-15 DIAGNOSIS — M810 Age-related osteoporosis without current pathological fracture: Secondary | ICD-10-CM | POA: Diagnosis not present

## 2019-02-22 DIAGNOSIS — M1711 Unilateral primary osteoarthritis, right knee: Secondary | ICD-10-CM | POA: Diagnosis not present

## 2019-02-27 ENCOUNTER — Other Ambulatory Visit: Payer: Self-pay | Admitting: Family Medicine

## 2019-02-28 DIAGNOSIS — M810 Age-related osteoporosis without current pathological fracture: Secondary | ICD-10-CM | POA: Diagnosis not present

## 2019-03-01 DIAGNOSIS — M1711 Unilateral primary osteoarthritis, right knee: Secondary | ICD-10-CM | POA: Diagnosis not present

## 2019-03-09 ENCOUNTER — Ambulatory Visit: Payer: Medicaid Other | Admitting: Family Medicine

## 2019-03-10 DIAGNOSIS — M79672 Pain in left foot: Secondary | ICD-10-CM | POA: Diagnosis not present

## 2019-03-10 DIAGNOSIS — M549 Dorsalgia, unspecified: Secondary | ICD-10-CM | POA: Diagnosis not present

## 2019-03-10 DIAGNOSIS — G8929 Other chronic pain: Secondary | ICD-10-CM | POA: Diagnosis not present

## 2019-04-09 DIAGNOSIS — G8929 Other chronic pain: Secondary | ICD-10-CM | POA: Diagnosis not present

## 2019-04-09 DIAGNOSIS — M549 Dorsalgia, unspecified: Secondary | ICD-10-CM | POA: Diagnosis not present

## 2019-04-09 DIAGNOSIS — M79672 Pain in left foot: Secondary | ICD-10-CM | POA: Diagnosis not present

## 2019-04-18 ENCOUNTER — Ambulatory Visit: Payer: Medicare Other | Admitting: Family Medicine

## 2019-04-27 ENCOUNTER — Encounter: Payer: Medicare Other | Admitting: Family Medicine

## 2019-04-27 ENCOUNTER — Other Ambulatory Visit: Payer: Self-pay

## 2019-05-02 ENCOUNTER — Encounter: Payer: Self-pay | Admitting: Family Medicine

## 2019-05-15 NOTE — Progress Notes (Signed)
No show

## 2019-05-22 ENCOUNTER — Other Ambulatory Visit: Payer: Self-pay | Admitting: Family Medicine

## 2019-05-22 ENCOUNTER — Ambulatory Visit: Payer: Medicare Other | Attending: Internal Medicine

## 2019-05-22 DIAGNOSIS — Z23 Encounter for immunization: Secondary | ICD-10-CM

## 2019-05-22 NOTE — Progress Notes (Signed)
   Covid-19 Vaccination Clinic  Name:  Ashley Frederick    MRN: [id number] DOB: [birth year]  05/22/2019  Ashley Frederick was observed post Covid-19 immunization for 15 minutes without incidence. She was provided with Vaccine Information Sheet and instruction to access the V-Safe system.   Ashley Frederick was instructed to call 911 with any severe reactions post vaccine: Marland Kitchen Difficulty breathing  . Swelling of your face and throat  . A fast heartbeat  . A bad rash all over your body  . Dizziness and weakness    Immunizations Administered    Name Date Dose VIS Date Route   Pfizer COVID-19 Vaccine 05/22/2019 12:07 PM 0.3 mL 04/08/2019 Intramuscular   Manufacturer: [place]   Lot: [id number]   [place]: [id number]

## 2019-05-22 NOTE — Telephone Encounter (Signed)
Requested Prescriptions  Pending Prescriptions Disp Refills  . metFORMIN (GLUCOPHAGE) 1000 MG tablet [Pharmacy Med Name: METFORMIN HCL 1,000 MG TABLET] 180 tablet 0    Sig: TAKE 1 TABLET (1,000 MG TOTAL) BY MOUTH 2 (TWO) TIMES DAILY WITH A MEAL.     Endocrinology:  Diabetes - Biguanides Passed - 05/22/2019  9:21 AM      Passed - Cr in normal range and within 360 days    Creat  Date Value Ref Range Status  04/05/2015 0.80 0.60 - 0.93 mg/dL Final   Creatinine, Ser  Date Value Ref Range Status  12/06/2018 0.85 0.57 - 1.00 mg/dL Final         Passed - HBA1C is between 0 and 7.9 and within 180 days    Hgb A1c MFr Bld  Date Value Ref Range Status  12/06/2018 7.0 (H) 4.8 - 5.6 % Final    Comment:             Prediabetes: 5.7 - 6.4          Diabetes: >6.4          Glycemic control for adults with diabetes: <7.0          Passed - eGFR in normal range and within 360 days    GFR, Est African American  Date Value Ref Range Status  04/05/2015 85 >=60 mL/min Final   GFR calc Af Amer  Date Value Ref Range Status  12/06/2018 76 >59 mL/min/1.73 Final   GFR, Est Non African American  Date Value Ref Range Status  04/05/2015 73 >=60 mL/min Final    Comment:      The estimated GFR is a calculation valid for adults (>=53 years old) that uses the CKD-EPI algorithm to adjust for age and sex. It is   not to be used for children, pregnant women, hospitalized patients,    patients on dialysis, or with rapidly changing kidney function. According to the NKDEP, eGFR >89 is normal, 60-89 shows mild impairment, 30-59 shows moderate impairment, 15-29 shows severe impairment and <15 is ESRD.      GFR calc non Af Amer  Date Value Ref Range Status  12/06/2018 66 >59 mL/min/1.73 Final   GFR  Date Value Ref Range Status  07/20/2012 92.18 >60.00 mL/min Final         Passed - Valid encounter within last 6 months    Recent Outpatient Visits          4 months ago Back pain, unspecified back  location, unspecified back pain laterality, unspecified chronicity   Primary Care at [facility], Fenton Malling, MD   5 months ago Type 2 diabetes mellitus without complication, without long-term current use of insulin [facility])   Primary Care at [facility], Arlie Solomons, MD   1 year ago Cough   Primary Care at [facility], Arlie Solomons, MD   1 year ago Lipoma of face   Primary Care at [facility], Arlie Solomons, MD   1 year ago Age-related osteoporosis without current pathological fracture   Primary Care at [facility], Arlie Solomons, MD

## 2019-05-30 ENCOUNTER — Ambulatory Visit (INDEPENDENT_AMBULATORY_CARE_PROVIDER_SITE_OTHER): Payer: Medicare Other | Admitting: Podiatry

## 2019-05-30 ENCOUNTER — Encounter: Payer: Self-pay | Admitting: Podiatry

## 2019-05-30 ENCOUNTER — Other Ambulatory Visit: Payer: Self-pay

## 2019-05-30 ENCOUNTER — Ambulatory Visit (INDEPENDENT_AMBULATORY_CARE_PROVIDER_SITE_OTHER): Payer: Medicare Other | Admitting: Orthotics

## 2019-05-30 DIAGNOSIS — E1142 Type 2 diabetes mellitus with diabetic polyneuropathy: Secondary | ICD-10-CM | POA: Diagnosis not present

## 2019-05-30 DIAGNOSIS — E1140 Type 2 diabetes mellitus with diabetic neuropathy, unspecified: Secondary | ICD-10-CM | POA: Insufficient documentation

## 2019-05-30 NOTE — Progress Notes (Signed)
This patient presents the office for diabetic foot exam as well as requesting diabetic shoes.  Patient is diabetic and is taking insulin and Metformin.  Patient has been diagnosed with diabetic neuropathy.  He presents the office today requesting diabetic shoes.    Vascular  Dorsalis pedis and posterior tibial pulses are palpable  B/L.  Capillary return  WNL.  Temperature gradient is  WNL.  Skin turgor  WNL  Sensorium  Senn Weinstein monofilament wire  absent . Absent  tactile sensation.  Nail Exam  Patient has normal nails with no evidence of bacterial or fungal infection.  Orthopedic  Exam  Muscle tone and muscle strength  WNL.  No limitations of motion feet  B/L.  No crepitus or joint effusion noted.  Foot type is unremarkable and digits show no abnormalities.  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  Skin  No open lesions.  Normal skin texture and turgor.   Diabetic neuropathy.    IE.  Diabetic foot exam reveals no evidence of vascular pathology.  Patient has diminished/absent  LOPS  B/L.  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  Patient to make an appointment with the pedorthikst.  RTC 1 year for annual foot exam.   Gardiner Barefoot DPM

## 2019-06-08 DIAGNOSIS — Z961 Presence of intraocular lens: Secondary | ICD-10-CM | POA: Diagnosis not present

## 2019-06-08 DIAGNOSIS — H43813 Vitreous degeneration, bilateral: Secondary | ICD-10-CM | POA: Diagnosis not present

## 2019-06-13 ENCOUNTER — Ambulatory Visit: Payer: Medicare Other | Attending: Internal Medicine

## 2019-06-13 DIAGNOSIS — Z23 Encounter for immunization: Secondary | ICD-10-CM | POA: Insufficient documentation

## 2019-06-13 NOTE — Progress Notes (Signed)
   Covid-19 Vaccination Clinic  Name:  Ashley Frederick    MRN: [id number] DOB: [birth year]  06/13/2019  [name] was observed post Covid-19 immunization for 15 minutes without incidence. She was provided with Vaccine Information Sheet and instruction to access the V-Safe system.   [name] was instructed to call 911 with any severe reactions post vaccine: Marland Kitchen Difficulty breathing  . Swelling of your face and throat  . A fast heartbeat  . A bad rash all over your body  . Dizziness and weakness    Immunizations Administered    Name Date Dose VIS Date Route   Pfizer COVID-19 Vaccine 06/13/2019  8:43 AM 0.3 mL 04/08/2019 Intramuscular   Manufacturer: [place]   Lot: [id number]   [place]: [id number]

## 2019-07-07 ENCOUNTER — Telehealth: Payer: Self-pay | Admitting: Podiatry

## 2019-07-07 ENCOUNTER — Telehealth: Payer: Self-pay | Admitting: Family Medicine

## 2019-07-07 NOTE — Telephone Encounter (Signed)
Pt husband came in a dropped off paperwork from [place] and [place] for provider to sign. Left paper work in Museum/gallery conservator. Pt would like a call when papers are ready for pick up [phone]  Please advise.

## 2019-07-07 NOTE — Telephone Encounter (Signed)
Pt and husband came in to check on shoes. I explained that we have not gotten signed  paperwork from pcp. I gave him paperwork to take to pcp.

## 2019-08-09 ENCOUNTER — Other Ambulatory Visit: Payer: Self-pay

## 2019-08-09 DIAGNOSIS — I10 Essential (primary) hypertension: Secondary | ICD-10-CM

## 2019-08-09 DIAGNOSIS — E119 Type 2 diabetes mellitus without complications: Secondary | ICD-10-CM

## 2019-08-09 MED ORDER — METFORMIN HCL 1000 MG PO TABS: 1000.0000 mg | ORAL_TABLET | Freq: Two times a day (BID) | ORAL | 0 refills | Status: DC

## 2019-08-09 MED ORDER — ATENOLOL 100 MG PO TABS: 100.0000 mg | ORAL_TABLET | Freq: Every day | ORAL | 3 refills | Status: AC

## 2019-08-15 ENCOUNTER — Other Ambulatory Visit: Payer: Self-pay

## 2019-08-15 ENCOUNTER — Ambulatory Visit (INDEPENDENT_AMBULATORY_CARE_PROVIDER_SITE_OTHER): Payer: Medicare Other | Admitting: Family Medicine

## 2019-08-15 ENCOUNTER — Encounter: Payer: Self-pay | Admitting: Family Medicine

## 2019-08-15 VITALS — BP 134/88 | HR 67 | Temp 97.6°F | Resp 17 | Ht 60.0 in | Wt 179.6 lb

## 2019-08-15 DIAGNOSIS — E119 Type 2 diabetes mellitus without complications: Secondary | ICD-10-CM

## 2019-08-15 DIAGNOSIS — Z23 Encounter for immunization: Secondary | ICD-10-CM

## 2019-08-15 DIAGNOSIS — E1165 Type 2 diabetes mellitus with hyperglycemia: Secondary | ICD-10-CM | POA: Diagnosis not present

## 2019-08-15 DIAGNOSIS — I10 Essential (primary) hypertension: Secondary | ICD-10-CM | POA: Diagnosis not present

## 2019-08-15 DIAGNOSIS — E034 Atrophy of thyroid (acquired): Secondary | ICD-10-CM

## 2019-08-15 DIAGNOSIS — Z135 Encounter for screening for eye and ear disorders: Secondary | ICD-10-CM

## 2019-08-15 LAB — POCT GLYCOSYLATED HEMOGLOBIN (HGB A1C): Hemoglobin A1C: 7 % — AB (ref 4.0–5.6)

## 2019-08-15 NOTE — Progress Notes (Signed)
Established Patient Office Visit  Subjective:  Patient ID: Ashley Frederick, female    DOB: 01-Dec-1940  Age: 79 y.o. MRN: [id number]  CC:  Chief Complaint  Patient presents with  . Medical Management of Chronic Issues  . Medication Refill    synthyroid   Translator ID [id number] Arabic  HPI [name] presents for   Thyroid  She is down to only 3 thyroid pills remaining She takes the thyroid medication before sunrise She is taking her medication as instructed but during ramadan she has to take the medication and have something to eat very early in the morning. She denies any changes such as hairloss, skin changes. Lab Results  Component Value Date   TSH 4.570 (H) 12/06/2018    Diabetes Lab Results  Component Value Date   HGBA1C 7.0 (H) 12/06/2018   She states that she is observing Ramadan for a month She states that she is taking her metformin before sunrise and after sunset when she breaks her fast She denies any hypoglycemia She states that she is working hard on her sugars.  Lab Results  Component Value Date   CREATININE 0.85 12/06/2018    Hypertension: Patient here for follow-up of elevated blood pressure. She is not exercising and is adherent to low salt diet.  Blood pressure is well controlled at home. Cardiac symptoms none. Patient denies chest pain, claudication, dyspnea and exertional chest pressure/discomfort.  Cardiovascular risk factors: advanced age (older than 58 for men, 4 for women), diabetes mellitus, dyslipidemia, hypertension and obesity (BMI >= 30 kg/m2). Use of agents associated with hypertension: thyroid hormones. History of target organ damage: none. BP Readings from Last 3 Encounters:  08/15/19 134/88  01/13/19 (!) 175/95  12/06/18 140/80      Past Medical History:  Diagnosis Date  . Diabetes mellitus, type 2 ([place])   . DYSLIPIDEMIA   . HYPERTENSION   . HYPOTHYROIDISM   . OA (osteoarthritis) of knee   .  OSTEOPOROSIS     Past Surgical History:  Procedure Laterality Date  . NO PAST SURGERIES    . THYROID SURGERY      No family history on file.  Social History   Socioeconomic History  . Marital status: Married    Spouse name: Ayoub  . Number of children: 6  . Years of education: Not on file  . Highest education level: Not on file  Occupational History  . Not on file  Tobacco Use  . Smoking status: Never Smoker  . Smokeless tobacco: Never Used  . Tobacco comment: Moved to [place] [address] to be closer to family- lives with son  Substance and Sexual Activity  . Alcohol use: No  . Drug use: No  . Sexual activity: Not Currently  Other Topics Concern  . Not on file  Social History Narrative   ** Merged History Encounter **      Marital status: married yet separated from husband      Children: 6 children; 8 grandchildren      Lives: with son; husband lives in [place] yet separately.      Employment: retired.               Social Determinants of Health   Financial Resource Strain:   . Difficulty of Paying Living Expenses:   Food Insecurity:   . Worried About Charity fundraiser in the Last Year:   . [place] in the Last Year:  Transportation Needs:   . Film/video editor (Medical):   Marland Kitchen Lack of Transportation (Non-Medical):   Physical Activity:   . Days of Exercise per Week:   . Minutes of Exercise per Session:   Stress:   . Feeling of Stress :   Social Connections:   . Frequency of Communication with Friends and Family:   . Frequency of Social Gatherings with Friends and Family:   . Attends Religious Services:   . Active Member of Clubs or Organizations:   . Attends Archivist Meetings:   Marland Kitchen Marital Status:   Intimate Partner Violence:   . Fear of Current or Ex-Partner:   . Emotionally Abused:   Marland Kitchen Physically Abused:   . Sexually Abused:     Outpatient Medications Prior to Visit  Medication Sig Dispense Refill  . amLODipine (NORVASC)  10 MG tablet TAKE 1 TABLET BY MOUTH EVERY DAY 90 tablet 3  . aspirin 81 MG chewable tablet Chew 1 tablet (81 mg total) by mouth daily. 90 tablet 3  . atenolol (TENORMIN) 100 MG tablet Take 1 tablet (100 mg total) by mouth daily. 90 tablet 3  . atorvastatin (LIPITOR) 40 MG tablet TAKE 1 TABLET (40 MG TOTAL) BY MOUTH DAILY AT 6 PM. 90 tablet 3  . cetirizine (ZYRTEC) 10 MG tablet Take 1 tablet (10 mg total) by mouth daily. For itching and rash 30 tablet 11  . Cholecalciferol (VITAMIN D3) 1000 units CAPS Take 1 capsule (1,000 Units total) by mouth 2 (two) times daily. 60 capsule 11  . diclofenac sodium (VOLTAREN) 1 % GEL Apply 2 g topically 4 (four) times daily. 100 g 11  . glucose blood test strip Check sugar three times daily E11.69  ACCUCHECK 100 each 12  . Lancets MISC Check sugar once daily dx DMII non-insulin no complications  ACCUCHECK 100 each 3  . levothyroxine (SYNTHROID) 75 MCG tablet Take 1 tablet (75 mcg total) by mouth daily. 90 tablet 3  . losartan (COZAAR) 100 MG tablet Take 1 tablet (100 mg total) by mouth daily. 90 tablet 3  . meloxicam (MOBIC) 7.5 MG tablet TAKE 1 TABLET TWICE A DAY WITH FOOD AS NEEDED FOR PAIN 60 tablet 2  . alendronate (FOSAMAX) 70 MG tablet TAKE 1 TABLET EVERY 7 DAYS WITH A FULL GLASS OF WATER. DO NOT LIE DOWN FOR THE NEXT 30 MINS 12 tablet 3  . hydrocortisone 2.5 % cream Apply topically 2 (two) times daily. For itching (Patient not taking: Reported on 08/15/2019) 30 g 0  . metFORMIN (GLUCOPHAGE) 1000 MG tablet Take 1 tablet (1,000 mg total) by mouth 2 (two) times daily with a meal. 180 tablet 0   No facility-administered medications prior to visit.    Allergies  Allergen Reactions  . Hctz [Hydrochlorothiazide] Rash    ROS Review of Systems Review of Systems  Constitutional: Negative for activity change, appetite change, chills and fever.  HENT: Negative for congestion, nosebleeds, trouble swallowing and voice change.   Respiratory: Negative for cough,  shortness of breath and wheezing.   Gastrointestinal: Negative for diarrhea, nausea and vomiting.  Genitourinary: Negative for difficulty urinating, dysuria, flank pain and hematuria.  Musculoskeletal: Negative for back pain, joint swelling and neck pain.  Neurological: Negative for dizziness, speech difficulty, light-headedness and numbness.  See HPI. All other review of systems negative.     Objective:    Physical Exam  BP 134/88   Pulse 67   Temp 97.6 F (36.4 C) (Temporal)   Resp  17   Ht 5' (1.524 m)   Wt 179 lb 9.6 oz (81.5 kg)   SpO2 99%   BMI 35.08 kg/m  Wt Readings from Last 3 Encounters:  08/15/19 179 lb 9.6 oz (81.5 kg)  01/13/19 181 lb (82.1 kg)  12/06/18 180 lb (81.6 kg)    BP Readings from Last 3 Encounters:  08/15/19 134/88  01/13/19 (!) 175/95  12/06/18 140/80   Physical Exam  Constitutional: Oriented to person, place, and time. Appears well-developed and well-nourished.  Wearing hijab. HENT:  Head: Normocephalic and atraumatic.  Eyes: Conjunctivae and EOM are normal.  Cardiovascular: Normal rate, regular rhythm, normal heart sounds and intact distal pulses.  No murmur heard. Pulmonary/Chest: Effort normal and breath sounds normal. No stridor. No respiratory distress. Has no wheezes.  Neurological: Is alert and oriented to person, place, and time.  Skin: Skin is warm. Capillary refill takes less than 2 seconds.  Psychiatric: Has a normal mood and affect. Behavior is normal. Judgment and thought content normal.   Health Maintenance Due  Topic Date Due  . OPHTHALMOLOGY EXAM  Never done  . TETANUS/TDAP  Never done  . PNA vac Low Risk Adult (2 of 2 - PPSV23) 04/18/2018  . HEMOGLOBIN A1C  06/08/2019    There are no preventive care reminders to display for this patient.  Lab Results  Component Value Date   TSH 4.570 (H) 12/06/2018   Lab Results  Component Value Date   WBC 8.0 12/06/2018   HGB 11.3 12/06/2018   HCT 35.7 12/06/2018   MCV 80  12/06/2018   PLT 273 12/06/2018   Lab Results  Component Value Date   NA 139 12/06/2018   K 4.7 12/06/2018   CO2 24 12/06/2018   GLUCOSE 115 (H) 12/06/2018   BUN 19 12/06/2018   CREATININE 0.85 12/06/2018   BILITOT 0.3 12/06/2018   ALKPHOS 70 12/06/2018   AST 13 12/06/2018   ALT 8 12/06/2018   PROT 6.6 12/06/2018   ALBUMIN 4.3 12/06/2018   CALCIUM 9.3 12/06/2018   GFR 92.18 07/20/2012   Lab Results  Component Value Date   CHOL 150 12/06/2018   Lab Results  Component Value Date   HDL 54 12/06/2018   Lab Results  Component Value Date   LDLCALC 74 12/06/2018   Lab Results  Component Value Date   TRIG 110 12/06/2018   Lab Results  Component Value Date   CHOLHDL 2.8 12/06/2018   Lab Results  Component Value Date   HGBA1C 7.0 (H) 12/06/2018      Assessment & Plan:   Problem List Items Addressed This Visit      Cardiovascular and Mediastinum   Essential hypertension - Primary Patient's blood pressure is at goal of 139/89 or less. Condition is stable. Continue current medications and treatment plan. I recommend that you exercise for 30-45 minutes 5 days a week. I also recommend a balanced diet with fruits and vegetables every day, lean meats, and little fried foods. The DASH diet (you can find this online) is a good example of this.    Relevant Orders   CMP14+EGFR   Lipid panel     Endocrine   Hypothyroidism - discussed thyroid trend of tsh Will monitor and refill based on latest tsh Currently stable   Relevant Orders   TSH   Diabetes mellitus, type 2 (HCC)  -  Discussed dietrary changes during Ramadan   Relevant Orders   CMP14+EGFR   POCT glycosylated hemoglobin (Hb A1C)  Other Visit Diagnoses       No orders of the defined types were placed in this encounter.   Follow-up: No follow-ups on file.    Forrest Moron, MD

## 2019-08-15 NOTE — Patient Instructions (Signed)
° ° ° °  If you have lab work done today you will be contacted with your lab results within the next 2 weeks.  If you have not heard from us then please contact us. The fastest way to get your results is to register for My Chart. ° ° °IF you received an x-ray today, you will receive an invoice from [facility]. Please contact [facility] at [phone] with questions or concerns regarding your invoice.  ° °IF you received labwork today, you will receive an invoice from [facility]. Please contact [facility] at [phone] with questions or concerns regarding your invoice.  ° °Our billing staff will not be able to assist you with questions regarding bills from these companies. ° °You will be contacted with the lab results as soon as they are available. The fastest way to get your results is to activate your My Chart account. Instructions are located on the last page of this paperwork. If you have not heard from us regarding the results in 2 weeks, please contact this office. °  ° ° ° °

## 2019-08-16 ENCOUNTER — Other Ambulatory Visit: Payer: Self-pay | Admitting: Family Medicine

## 2019-08-16 LAB — CMP14+EGFR
ALT: 8 IU/L (ref 0–32)
AST: 17 IU/L (ref 0–40)
Albumin/Globulin Ratio: 1.4 (ref 1.2–2.2)
Albumin: 4 g/dL (ref 3.7–4.7)
Alkaline Phosphatase: 80 IU/L (ref 39–117)
BUN/Creatinine Ratio: 23 (ref 12–28)
BUN: 22 mg/dL (ref 8–27)
Bilirubin Total: <0.2 mg/dL (ref 0.0–1.2)
CO2: 20 mmol/L (ref 20–29)
Calcium: 9.6 mg/dL (ref 8.7–10.3)
Chloride: 104 mmol/L (ref 96–106)
Creatinine, Ser: 0.94 mg/dL (ref 0.57–1.00)
GFR calc Af Amer: 67 mL/min/{1.73_m2} (ref >59)
GFR calc non Af Amer: 58 mL/min/{1.73_m2} — ABNORMAL LOW (ref >59)
Globulin, Total: 2.8 g/dL (ref 1.5–4.5)
Glucose: 155 mg/dL — ABNORMAL HIGH (ref 65–99)
Potassium: 4.6 mmol/L (ref 3.5–5.2)
Sodium: 143 mmol/L (ref 134–144)
Total Protein: 6.8 g/dL (ref 6.0–8.5)

## 2019-08-16 LAB — LIPID PANEL
Chol/HDL Ratio: 4.9 ratio — ABNORMAL HIGH (ref 0.0–4.4)
Cholesterol, Total: 187 mg/dL (ref 100–199)
HDL: 38 mg/dL — ABNORMAL LOW (ref >39)
LDL Chol Calc (NIH): 99 mg/dL (ref 0–99)
Triglycerides: 295 mg/dL — ABNORMAL HIGH (ref 0–149)
VLDL Cholesterol Cal: 50 mg/dL — ABNORMAL HIGH (ref 5–40)

## 2019-08-16 LAB — TSH: TSH: 4.44 u[IU]/mL (ref 0.450–4.500)

## 2019-08-16 MED ORDER — LEVOTHYROXINE SODIUM 75 MCG PO TABS: 75.0000 ug | ORAL_TABLET | Freq: Every day | ORAL | 3 refills | Status: AC

## 2019-09-05 DIAGNOSIS — M810 Age-related osteoporosis without current pathological fracture: Secondary | ICD-10-CM | POA: Diagnosis not present

## 2019-09-19 DIAGNOSIS — M810 Age-related osteoporosis without current pathological fracture: Secondary | ICD-10-CM | POA: Diagnosis not present

## 2019-09-27 ENCOUNTER — Encounter: Payer: Medicare Other | Admitting: Family Medicine

## 2019-11-01 IMAGING — DX DG CHEST 2V
2 series · 2 of 2 positions shown · non-contrast
Comparison: PA and lateral chest 03/16/2017.

CLINICAL DATA: Cough.

EXAM:
CHEST - 2 VIEW

[chest pa]
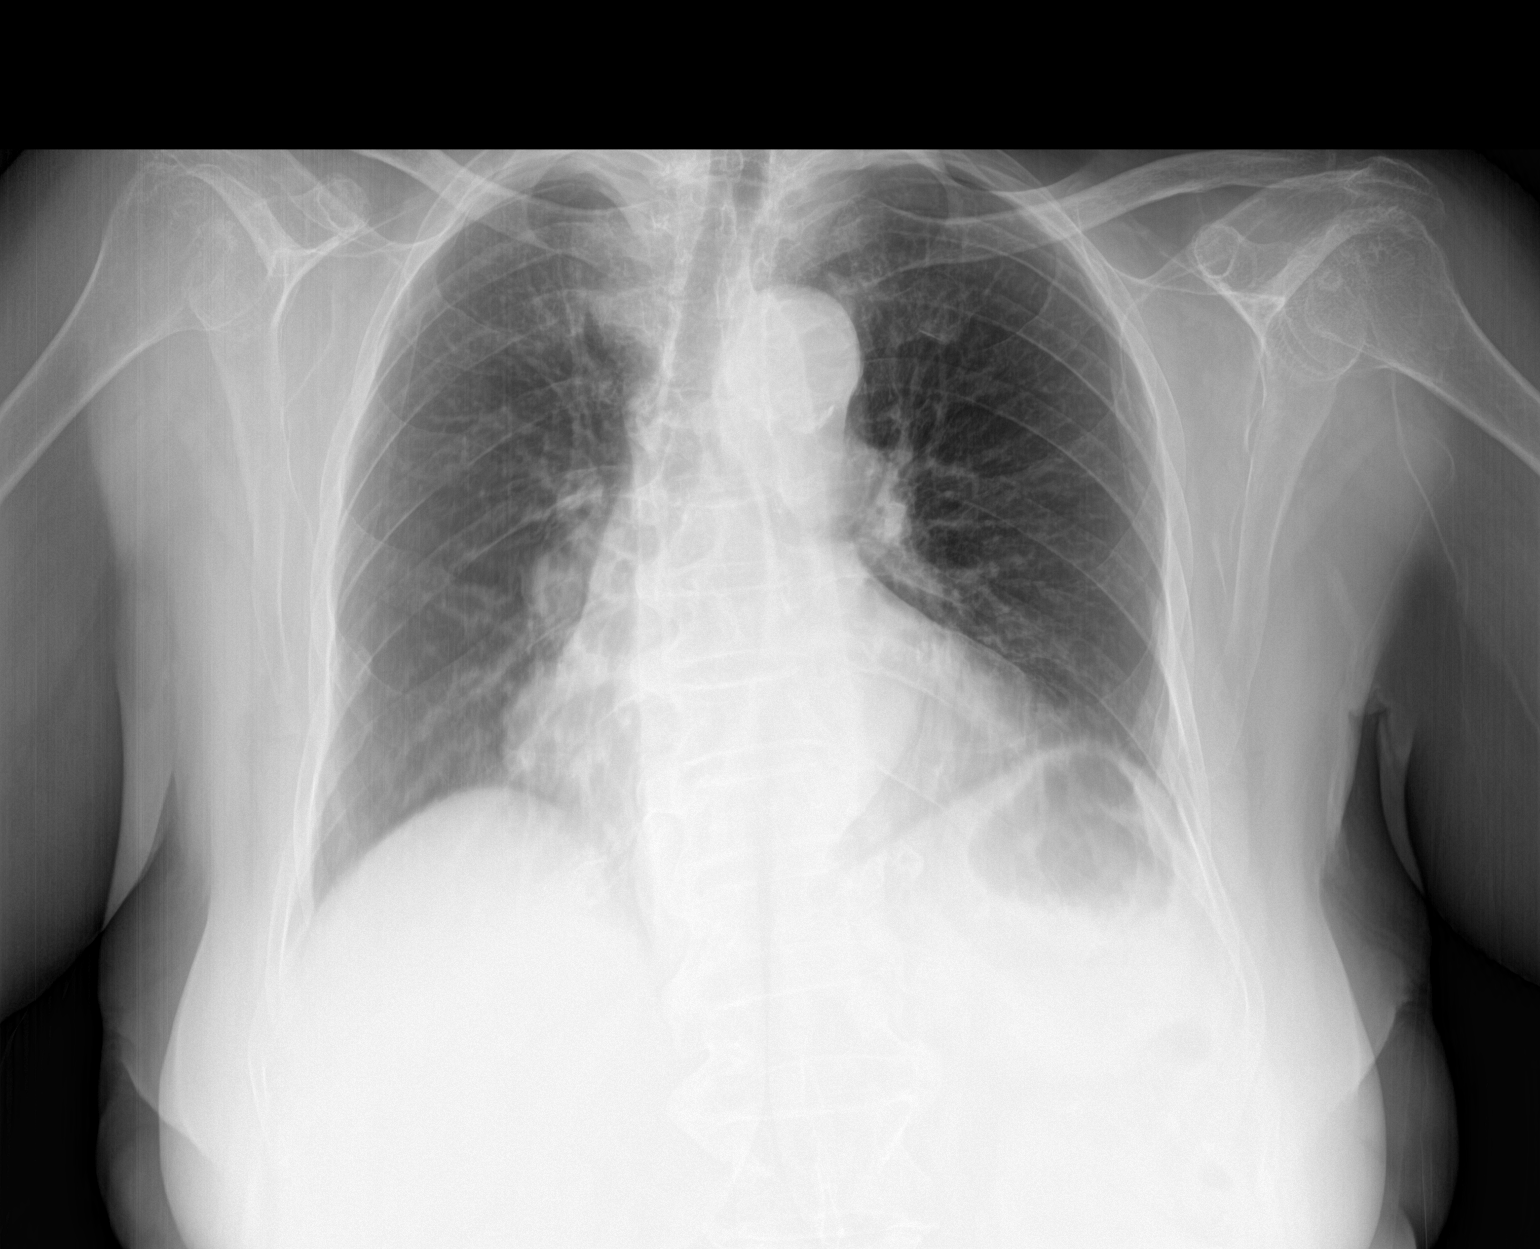

[chest lat]
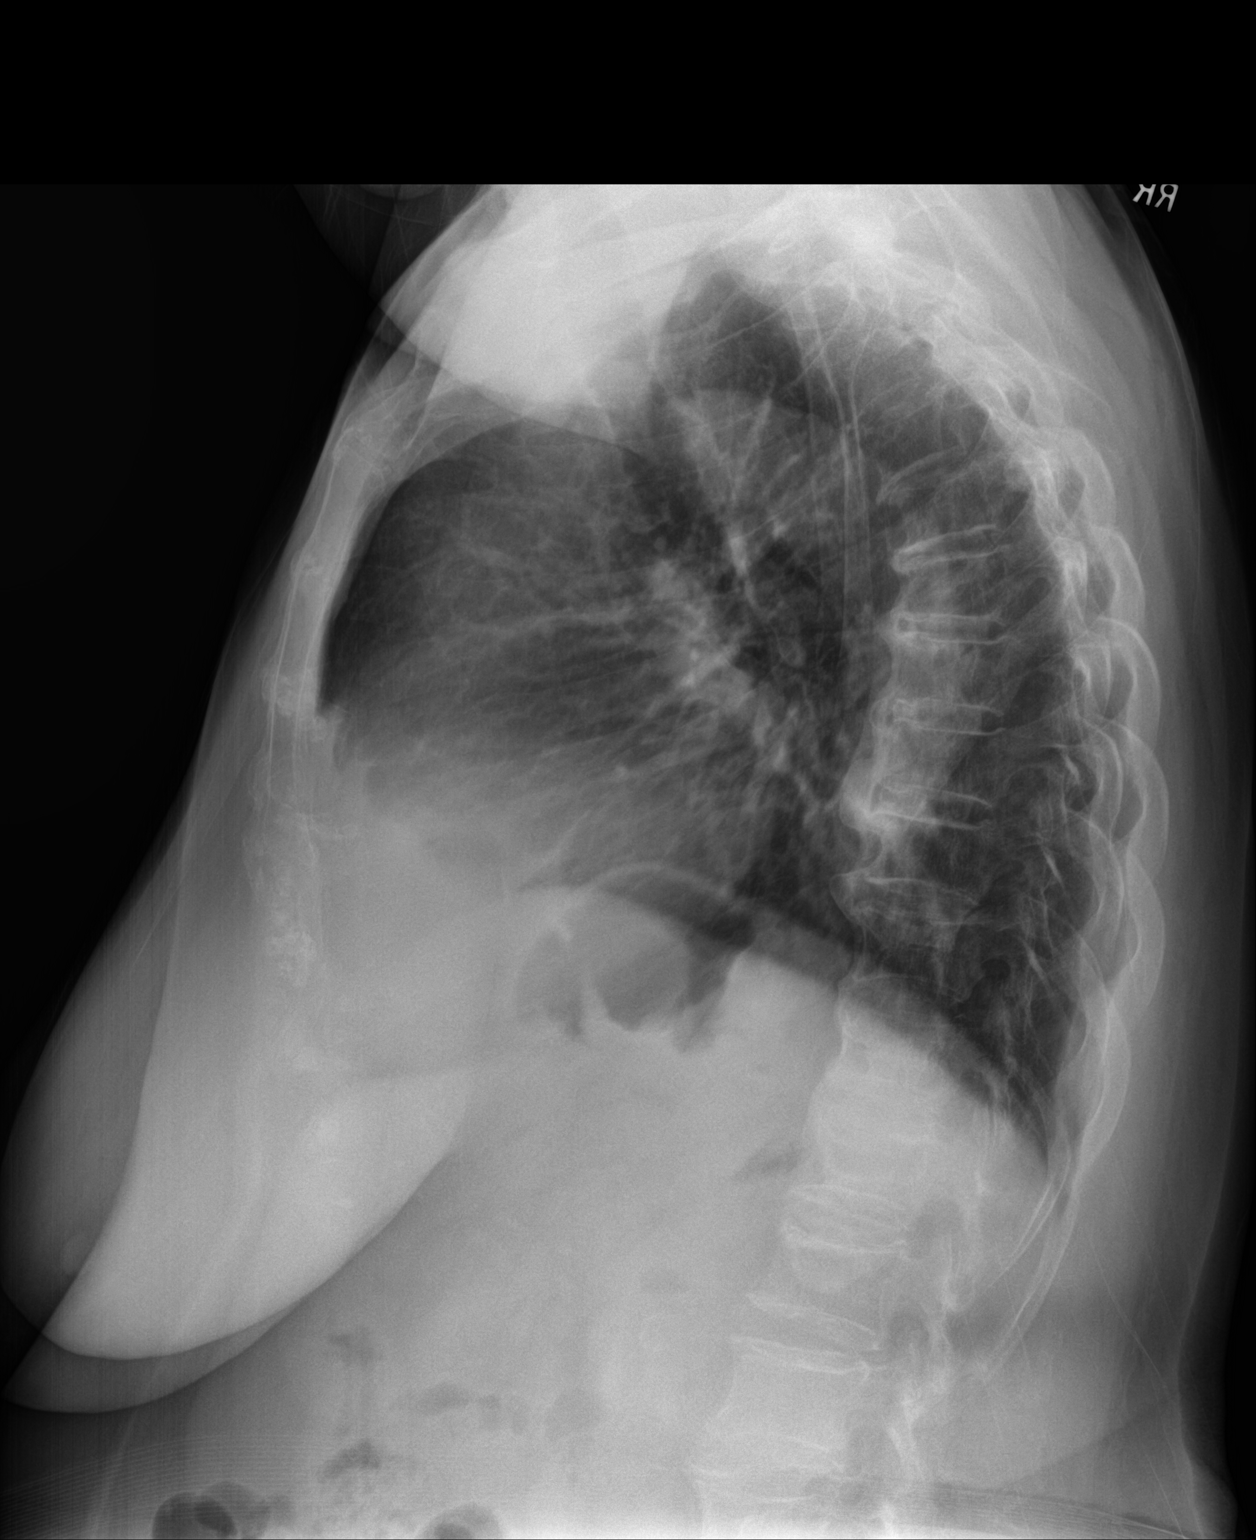

[2 of 2 positions shown; findings below may reference images not displayed]

FINDINGS: The lungs are clear. Heart size is enlarged. No pneumothorax or
pleural fluid. No acute or focal bony abnormality.
IMPRESSION: Cardiomegaly without acute disease.

## 2020-01-18 ENCOUNTER — Other Ambulatory Visit: Payer: Self-pay | Admitting: Family Medicine

## 2020-01-18 DIAGNOSIS — E119 Type 2 diabetes mellitus without complications: Secondary | ICD-10-CM

## 2020-01-18 NOTE — Telephone Encounter (Signed)
Patient requesting Rx for potassium- not current on list. Other medications requested through [facility] . Note for potassium sent to office for review.

## 2020-01-18 NOTE — Telephone Encounter (Signed)
Medication Refill - Medication: potassium, metformin, levothyroxine, glucose blood test strip  Has the patient contacted their pharmacy? Yes.   (Agent: If no, request that the patient contact the pharmacy for the refill.) (Agent: If yes, when and what did the pharmacy advise?)  Preferred Pharmacy (with phone number or street name):  [id number] - [place] [place] [address]  Phone: [phone] Fax: [phone]  Hours: Not open 24 hours     Agent: Please be advised that RX refills may take up to 3 business days. We ask that you follow-up with your pharmacy.

## 2020-01-31 ENCOUNTER — Ambulatory Visit (INDEPENDENT_AMBULATORY_CARE_PROVIDER_SITE_OTHER): Payer: Medicare Other

## 2020-01-31 ENCOUNTER — Encounter: Payer: Self-pay | Admitting: Registered Nurse

## 2020-01-31 ENCOUNTER — Ambulatory Visit (INDEPENDENT_AMBULATORY_CARE_PROVIDER_SITE_OTHER): Payer: Medicare Other | Admitting: Registered Nurse

## 2020-01-31 ENCOUNTER — Other Ambulatory Visit: Payer: Self-pay

## 2020-01-31 VITALS — BP 151/80 | HR 65 | Temp 97.7°F | Resp 18 | Ht 60.0 in | Wt 185.4 lb

## 2020-01-31 DIAGNOSIS — R058 Other specified cough: Secondary | ICD-10-CM

## 2020-01-31 DIAGNOSIS — R062 Wheezing: Secondary | ICD-10-CM

## 2020-01-31 DIAGNOSIS — I10 Essential (primary) hypertension: Secondary | ICD-10-CM | POA: Diagnosis not present

## 2020-01-31 DIAGNOSIS — E1165 Type 2 diabetes mellitus with hyperglycemia: Secondary | ICD-10-CM | POA: Diagnosis not present

## 2020-01-31 DIAGNOSIS — E034 Atrophy of thyroid (acquired): Secondary | ICD-10-CM

## 2020-01-31 LAB — POCT GLYCOSYLATED HEMOGLOBIN (HGB A1C): Hemoglobin A1C: 7.1 % — AB (ref 4.0–5.6)

## 2020-01-31 MED ORDER — GLUCOSE BLOOD VI STRP: ORAL_STRIP | 12 refills | Status: DC

## 2020-01-31 MED ORDER — LOSARTAN POTASSIUM 100 MG PO TABS
100.0000 mg | ORAL_TABLET | Freq: Every day | ORAL | 3 refills | Status: AC
Start: 2020-01-31 — End: ?

## 2020-01-31 NOTE — Patient Instructions (Signed)
° ° ° °  If you have lab work done today you will be contacted with your lab results within the next 2 weeks.  If you have not heard from us then please contact us. The fastest way to get your results is to register for My Chart. ° ° °IF you received an x-ray today, you will receive an invoice from [facility]. Please contact [facility] at [phone] with questions or concerns regarding your invoice.  ° °IF you received labwork today, you will receive an invoice from [facility]. Please contact [facility] at [phone] with questions or concerns regarding your invoice.  ° °Our billing staff will not be able to assist you with questions regarding bills from these companies. ° °You will be contacted with the lab results as soon as they are available. The fastest way to get your results is to activate your My Chart account. Instructions are located on the last page of this paperwork. If you have not heard from us regarding the results in 2 weeks, please contact this office. °  ° ° ° °

## 2020-02-01 ENCOUNTER — Telehealth: Payer: Self-pay | Admitting: Registered Nurse

## 2020-02-01 LAB — TSH: TSH: 2.64 u[IU]/mL (ref 0.450–4.500)

## 2020-02-01 LAB — COMPREHENSIVE METABOLIC PANEL
ALT: 13 IU/L (ref 0–32)
AST: 14 IU/L (ref 0–40)
Albumin/Globulin Ratio: 1.7 (ref 1.2–2.2)
Albumin: 4.3 g/dL (ref 3.7–4.7)
Alkaline Phosphatase: 75 IU/L (ref 44–121)
BUN/Creatinine Ratio: 21 (ref 12–28)
BUN: 22 mg/dL (ref 8–27)
Bilirubin Total: 0.4 mg/dL (ref 0.0–1.2)
CO2: 23 mmol/L (ref 20–29)
Calcium: 9.7 mg/dL (ref 8.7–10.3)
Chloride: 101 mmol/L (ref 96–106)
Creatinine, Ser: 1.06 mg/dL — ABNORMAL HIGH (ref 0.57–1.00)
GFR calc Af Amer: 58 mL/min/{1.73_m2} — ABNORMAL LOW (ref >59)
GFR calc non Af Amer: 50 mL/min/{1.73_m2} — ABNORMAL LOW (ref >59)
Globulin, Total: 2.6 g/dL (ref 1.5–4.5)
Glucose: 136 mg/dL — ABNORMAL HIGH (ref 65–99)
Potassium: 4.9 mmol/L (ref 3.5–5.2)
Sodium: 139 mmol/L (ref 134–144)
Total Protein: 6.9 g/dL (ref 6.0–8.5)

## 2020-02-01 LAB — CBC WITH DIFFERENTIAL
Basophils Absolute: 0 10*3/uL (ref 0.0–0.2)
Basos: 0 %
EOS (ABSOLUTE): 0.1 10*3/uL (ref 0.0–0.4)
Eos: 1 %
Hematocrit: 35.7 % (ref 34.0–46.6)
Hemoglobin: 11.2 g/dL (ref 11.1–15.9)
Immature Grans (Abs): 0 10*3/uL (ref 0.0–0.1)
Immature Granulocytes: 0 %
Lymphocytes Absolute: 2.6 10*3/uL (ref 0.7–3.1)
Lymphs: 21 %
MCH: 25.6 pg — ABNORMAL LOW (ref 26.6–33.0)
MCHC: 31.4 g/dL — ABNORMAL LOW (ref 31.5–35.7)
MCV: 82 fL (ref 79–97)
Monocytes Absolute: 0.6 10*3/uL (ref 0.1–0.9)
Monocytes: 5 %
Neutrophils Absolute: 8.9 10*3/uL — ABNORMAL HIGH (ref 1.4–7.0)
Neutrophils: 73 %
RBC: 4.37 x10E6/uL (ref 3.77–5.28)
RDW: 16 % — ABNORMAL HIGH (ref 11.7–15.4)
WBC: 12.3 10*3/uL — ABNORMAL HIGH (ref 3.4–10.8)

## 2020-02-01 NOTE — Telephone Encounter (Signed)
Pt called states his haven't restive his prescription yet his called the pharmacy it not there yet please advice

## 2020-02-03 NOTE — Telephone Encounter (Signed)
Pt son is calling asking about Rx that were supposed to be sent to the pharmacy? I see no note of new Rx but he states theres a spray and a tablet she was supposed to get bc she is struggling to sleep at night from congestion please advise

## 2020-02-03 NOTE — Telephone Encounter (Signed)
Pt called said she keep going to the pharmacy and the med it not there please Advice

## 2020-02-09 ENCOUNTER — Other Ambulatory Visit: Payer: Self-pay | Admitting: Registered Nurse

## 2020-02-09 DIAGNOSIS — R0981 Nasal congestion: Secondary | ICD-10-CM

## 2020-02-09 MED ORDER — FLUTICASONE PROPIONATE 50 MCG/ACT NA SUSP: 2.0000 | Freq: Every day | NASAL | 6 refills | Status: AC

## 2020-02-09 MED ORDER — GUAIFENESIN-DM 100-10 MG/5ML PO SYRP: 5.0000 mL | ORAL_SOLUTION | ORAL | 0 refills | Status: AC | PRN

## 2020-03-25 ENCOUNTER — Encounter: Payer: Self-pay | Admitting: Registered Nurse

## 2020-03-25 NOTE — Progress Notes (Signed)
Established Patient Office Visit  Subjective:  Patient ID: Ashley Frederick, female    DOB: [birth year]  Age: 79 y.o. MRN: [id number]  CC:  Chief Complaint  Patient presents with   [place] states she is here for a medication refill on Pended medications. Patient would also like to discuss medication possibly for an cough she has had for months    HPI Ashley Frederick presents for [facility] and med refill  Formerly a patient of Dr. Nolon Rod  Hx of HTN: Hypertension: Patient Currently taking: losartan 100mg  PO qd Good effect. No AEs. Denies CV symptoms including: chest pain, shob, doe, headache, visual changes, fatigue, claudication, and dependent edema.   Previous readings and labs: BP Readings from Last 3 Encounters:  01/31/20 (!) 151/80  08/15/19 134/88  01/13/19 (!) 175/95   Lab Results  Component Value Date   CREATININE 1.06 (H) 01/31/2020    Also notes ongoing dry cough for months. Stable. Doesn't seem to have clear triggers. Has not had any acute illness. No clear pattern.  Otherwise feeling well.  Past Medical History:  Diagnosis Date   Diabetes mellitus, type 2 ([place])    DYSLIPIDEMIA    HYPERTENSION    HYPOTHYROIDISM    OA (osteoarthritis) of knee    OSTEOPOROSIS     Past Surgical History:  Procedure Laterality Date   NO PAST SURGERIES     THYROID SURGERY      No family history on file.  Social History   Socioeconomic History   Marital status: Married    Spouse name: Ayoub   Number of children: 6   Years of education: Not on file   Highest education level: Not on file  Occupational History   Not on file  Tobacco Use   Smoking status: Never Smoker   Smokeless tobacco: Never Used   Tobacco comment: Moved to [place] to be closer to family- lives with son  Vaping Use   Vaping Use: Never used  Substance and Sexual Activity   Alcohol use: No   Drug use: No   Sexual activity: Not  Currently  Other Topics Concern   Not on file  Social History Narrative   ** Merged History Encounter **      Marital status: married yet separated from husband      Children: 6 children; 8 grandchildren      Lives: with son; husband lives in [place] yet separately.      Employment: retired.               Social Determinants of Health   Financial Resource Strain:    Difficulty of Paying Living Expenses: Not on file  Food Insecurity:    Worried About Charity fundraiser in the Last Year: Not on file   [employer] of Food in the Last Year: Not on file  Transportation Needs:    Lack of Transportation (Medical): Not on file   Lack of Transportation (Non-Medical): Not on file  Physical Activity:    Days of Exercise per Week: Not on file   Minutes of Exercise per Session: Not on file  Stress:    Feeling of Stress : Not on file  Social Connections:    Frequency of Communication with Friends and Family: Not on file   Frequency of Social Gatherings with Friends and Family: Not on file   Attends Religious Services: Not on file   Active  Member of Clubs or Organizations: Not on file   Attends Archivist Meetings: Not on file   Marital Status: Not on file  Intimate Partner Violence:    Fear of Current or Ex-Partner: Not on file   Emotionally Abused: Not on file   Physically Abused: Not on file   Sexually Abused: Not on file    Outpatient Medications Prior to Visit  Medication Sig Dispense Refill   alendronate (FOSAMAX) 70 MG tablet TAKE 1 TABLET EVERY 7 DAYS WITH A FULL GLASS OF WATER. DO NOT LIE DOWN FOR THE NEXT 30 MINS 12 tablet 3   amLODipine (NORVASC) 10 MG tablet TAKE 1 TABLET BY MOUTH EVERY DAY 90 tablet 3   aspirin 81 MG chewable tablet Chew 1 tablet (81 mg total) by mouth daily. 90 tablet 3   atenolol (TENORMIN) 100 MG tablet Take 1 tablet (100 mg total) by mouth daily. 90 tablet 3   atorvastatin (LIPITOR) 40 MG tablet TAKE 1 TABLET (40 MG  TOTAL) BY MOUTH DAILY AT 6 PM. 90 tablet 3   cetirizine (ZYRTEC) 10 MG tablet Take 1 tablet (10 mg total) by mouth daily. For itching and rash 30 tablet 11   Cholecalciferol (VITAMIN D3) 1000 units CAPS Take 1 capsule (1,000 Units total) by mouth 2 (two) times daily. 60 capsule 11   diclofenac sodium (VOLTAREN) 1 % GEL Apply 2 g topically 4 (four) times daily. 100 g 11   Lancets MISC Check sugar once daily dx DMII non-insulin no complications  ACCUCHECK 100 each 3   levothyroxine (SYNTHROID) 75 MCG tablet Take 1 tablet (75 mcg total) by mouth daily. 90 tablet 3   meloxicam (MOBIC) 7.5 MG tablet TAKE 1 TABLET TWICE A DAY WITH FOOD AS NEEDED FOR PAIN 60 tablet 2   metFORMIN (GLUCOPHAGE) 1000 MG tablet Take 1 tablet (1,000 mg total) by mouth 2 (two) times daily with a meal. 180 tablet 0   glucose blood test strip Check sugar three times daily E11.69  ACCUCHECK 100 each 12   losartan (COZAAR) 100 MG tablet Take 1 tablet (100 mg total) by mouth daily. 90 tablet 3   hydrocortisone 2.5 % cream Apply topically 2 (two) times daily. For itching (Patient not taking: Reported on 08/15/2019) 30 g 0   No facility-administered medications prior to visit.    Allergies  Allergen Reactions   Hctz [Hydrochlorothiazide] Rash    ROS Review of Systems  Constitutional: Negative.   HENT: Negative.   Eyes: Negative.   Respiratory: Negative.   Cardiovascular: Negative.   Gastrointestinal: Negative.   Genitourinary: Negative.   Musculoskeletal: Negative.   Skin: Negative.   Neurological: Negative.   Psychiatric/Behavioral: Negative.       Objective:    Physical Exam Vitals and nursing note reviewed.  Constitutional:      General: She is not in acute distress.    Appearance: Normal appearance. She is normal weight. She is not ill-appearing, toxic-appearing or diaphoretic.  Cardiovascular:     Rate and Rhythm: Normal rate and regular rhythm.     Heart sounds: Normal heart sounds. No murmur  heard.  No friction rub. No gallop.   Pulmonary:     Effort: Pulmonary effort is normal. No respiratory distress.     Breath sounds: No stridor. Wheezing (mild throughout) present. No rhonchi or rales.  Chest:     Chest wall: No tenderness.  Skin:    General: Skin is warm and dry.  Neurological:  General: No focal deficit present.     Mental Status: She is alert and oriented to person, place, and time. Mental status is at baseline.  Psychiatric:        Mood and Affect: Mood normal.        Behavior: Behavior normal.        Thought Content: Thought content normal.        Judgment: Judgment normal.     BP (!) 151/80    Pulse 65    Temp 97.7 F (36.5 C) (Temporal)    Resp 18    Ht 5' (1.524 m)    Wt 185 lb 6.4 oz (84.1 kg)    SpO2 95%    BMI 36.21 kg/m  Wt Readings from Last 3 Encounters:  01/31/20 185 lb 6.4 oz (84.1 kg)  08/15/19 179 lb 9.6 oz (81.5 kg)  01/13/19 181 lb (82.1 kg)     Health Maintenance Due  Topic Date Due   Hepatitis C Screening  Never done    There are no preventive care reminders to display for this patient.  Lab Results  Component Value Date   TSH 2.640 01/31/2020   Lab Results  Component Value Date   WBC 12.3 (H) 01/31/2020   HGB 11.2 01/31/2020   HCT 35.7 01/31/2020   MCV 82 01/31/2020   PLT 273 12/06/2018   Lab Results  Component Value Date   NA 139 01/31/2020   K 4.9 01/31/2020   CO2 23 01/31/2020   GLUCOSE 136 (H) 01/31/2020   BUN 22 01/31/2020   CREATININE 1.06 (H) 01/31/2020   BILITOT 0.4 01/31/2020   ALKPHOS 75 01/31/2020   AST 14 01/31/2020   ALT 13 01/31/2020   PROT 6.9 01/31/2020   ALBUMIN 4.3 01/31/2020   CALCIUM 9.7 01/31/2020   GFR 92.18 07/20/2012   Lab Results  Component Value Date   CHOL 187 08/15/2019   Lab Results  Component Value Date   HDL 38 (L) 08/15/2019   Lab Results  Component Value Date   LDLCALC 99 08/15/2019   Lab Results  Component Value Date   TRIG 295 (H) 08/15/2019   Lab Results    Component Value Date   CHOLHDL 4.9 (H) 08/15/2019   Lab Results  Component Value Date   HGBA1C 7.1 (A) 01/31/2020      Assessment & Plan:   Problem List Items Addressed This Visit      Cardiovascular and Mediastinum   Essential hypertension   Relevant Medications   losartan (COZAAR) 100 MG tablet   Other Relevant Orders   Comprehensive metabolic panel (Completed)   CBC With Differential (Completed)     Endocrine   Hypothyroidism   Relevant Orders   TSH (Completed)   Diabetes mellitus, type 2 (HCC) - Primary   Relevant Medications   glucose blood test strip   losartan (COZAAR) 100 MG tablet   Other Relevant Orders   POCT glycosylated hemoglobin (Hb A1C) (Completed)    Other Visit Diagnoses    Wheezing       Relevant Orders   DG Chest 2 View (Completed)   Dry cough       Relevant Orders   DG Chest 2 View (Completed)      Meds ordered this encounter  Medications   glucose blood test strip    Sig: Check sugar three times daily E11.69  ACCUCHECK    Dispense:  100 each    Refill:  12   losartan (COZAAR) 100  MG tablet    Sig: Take 1 tablet (100 mg total) by mouth daily.    Dispense:  90 tablet    Refill:  3    Follow-up: No follow-ups on file.   PLAN  Refill losartan and glucose strips. Continue lifestyle control of both of these  DG chest shows no acute abnormalities. Will check cbc and cmp  Suggest OTC allergy meds - if no relief, contact office  Patient encouraged to call clinic with any questions, comments, or concerns.  Maximiano Coss, NP

## 2020-05-01 ENCOUNTER — Other Ambulatory Visit: Payer: Self-pay | Admitting: Registered Nurse

## 2020-05-01 DIAGNOSIS — E119 Type 2 diabetes mellitus without complications: Secondary | ICD-10-CM

## 2020-05-01 MED ORDER — AMLODIPINE BESYLATE 10 MG PO TABS: 10.0000 mg | ORAL_TABLET | Freq: Every day | ORAL | 3 refills | Status: AC

## 2020-05-01 MED ORDER — METFORMIN HCL 1000 MG PO TABS: 1000.0000 mg | ORAL_TABLET | Freq: Two times a day (BID) | ORAL | 0 refills | Status: AC

## 2020-05-01 NOTE — Telephone Encounter (Signed)
Copied from CRM [id number]. Topic: Quick Communication - Rx Refill/Question >> May 01, 2020 11:26 AM Jaquita Rector A wrote: Medication: metFORMIN (GLUCOPHAGE) 1000 MG tablet, amLODipine (NORVASC) 10 MG tablet  Completely out of her BP medication   Has the patient contacted their pharmacy? Yes.   (Agent: If no, request that the patient contact the pharmacy for the refill.) (Agent: If yes, when and what did the pharmacy advise?)  Preferred Pharmacy (with phone number or street name): [facility] [address] [place], [place] - [address] AT NEXT TO [facility]  Phone:  [phone] Fax:  [phone]     Agent: Please be advised that RX refills may take up to 3 business days. We ask that you follow-up with your pharmacy.

## 2020-05-01 NOTE — Telephone Encounter (Signed)
Requested Prescriptions  Pending Prescriptions Disp Refills  . metFORMIN (GLUCOPHAGE) 1000 MG tablet 180 tablet 0    Sig: Take 1 tablet (1,000 mg total) by mouth 2 (two) times daily with a meal.     Endocrinology:  Diabetes - Biguanides Failed - 05/01/2020 11:37 AM      Failed - Cr in normal range and within 360 days    Creat  Date Value Ref Range Status  04/05/2015 0.80 0.60 - 0.93 mg/dL Final   Creatinine, Ser  Date Value Ref Range Status  01/31/2020 1.06 (H) 0.57 - 1.00 mg/dL Final         Failed - eGFR in normal range and within 360 days    GFR, Est African American  Date Value Ref Range Status  04/05/2015 85 >=60 mL/min Final   GFR calc Af Amer  Date Value Ref Range Status  01/31/2020 58 (L) >59 mL/min/1.73 Final    Comment:    **[facility] currently reports eGFR in compliance with the current**   recommendations of the [employer]. [facility] will   update reporting as new guidelines are published from the NKF-ASN   Task force.    GFR, Est Non African American  Date Value Ref Range Status  04/05/2015 73 >=60 mL/min Final    Comment:      The estimated GFR is a calculation valid for adults (>=67 years old) that uses the CKD-EPI algorithm to adjust for age and sex. It is   not to be used for children, pregnant women, hospitalized patients,    patients on dialysis, or with rapidly changing kidney function. According to the NKDEP, eGFR >89 is normal, 60-89 shows mild impairment, 30-59 shows moderate impairment, 15-29 shows severe impairment and <15 is ESRD.      GFR calc non Af Amer  Date Value Ref Range Status  01/31/2020 50 (L) >59 mL/min/1.73 Final   GFR  Date Value Ref Range Status  07/20/2012 92.18 >60.00 mL/min Final         Passed - HBA1C is between 0 and 7.9 and within 180 days    Hemoglobin A1C  Date Value Ref Range Status  01/31/2020 7.1 (A) 4.0 - 5.6 % Final   Hgb A1c MFr Bld  Date Value Ref Range Status  12/06/2018 7.0 (H) 4.8 - 5.6  % Final    Comment:             Prediabetes: 5.7 - 6.4          Diabetes: >6.4          Glycemic control for adults with diabetes: <7.0          Passed - Valid encounter within last 6 months    Recent Outpatient Visits          3 months ago Type 2 diabetes mellitus with hyperglycemia, without long-term current use of insulin ([place])   Primary [facility] at [facility], Delfino Lovett, NP   8 months ago Essential hypertension   Primary Care at [facility], Zoe A, MD   1 year ago Back pain, unspecified back location, unspecified back pain laterality, unspecified chronicity   Primary Care at [facility], Fenton Malling, MD   1 year ago Type 2 diabetes mellitus without complication, without long-term current use of insulin [facility])   Primary Care at [facility], Arlie Solomons, MD   1 year ago Cough   Primary Care at [place], MD      Future Appointments  In 3 months Maximiano Coss, NP Primary Care at [facility], [place]           . amLODipine (NORVASC) 10 MG tablet 90 tablet 3    Sig: Take 1 tablet (10 mg total) by mouth daily.     Cardiovascular:  Calcium Channel Blockers Failed - 05/01/2020 11:37 AM      Failed - Last BP in normal range    BP Readings from Last 1 Encounters:  01/31/20 (!) 151/80         Passed - Valid encounter within last 6 months    Recent Outpatient Visits          3 months ago Type 2 diabetes mellitus with hyperglycemia, without long-term current use of insulin ([place])   Primary Care at [place], NP   8 months ago Essential hypertension   Primary Care at [facility], Zoe A, MD   1 year ago Back pain, unspecified back location, unspecified back pain laterality, unspecified chronicity   Primary Care at [facility], Fenton Malling, MD   1 year ago Type 2 diabetes mellitus without complication, without long-term current use of insulin [facility])   Primary Care at [place], MD   1 year ago Cough   [facility] at [facility],  Arlie Solomons, MD      Future Appointments            In 3 months Maximiano Coss, NP Primary Care at [place], [facility]

## 2020-07-31 ENCOUNTER — Ambulatory Visit: Payer: Self-pay | Admitting: Registered Nurse

## 2021-04-29 ENCOUNTER — Other Ambulatory Visit: Payer: Self-pay | Admitting: Registered Nurse

## 2021-04-29 DIAGNOSIS — E1165 Type 2 diabetes mellitus with hyperglycemia: Secondary | ICD-10-CM
